# Patient Record
Sex: Female | Born: 1970
Health system: Southern US, Community
[De-identification: ages and names within clinical notes are randomized; demographics above are authoritative.]

## PROBLEM LIST (undated history)

## (undated) DIAGNOSIS — D649 Anemia, unspecified: Secondary | ICD-10-CM

## (undated) DIAGNOSIS — K219 Gastro-esophageal reflux disease without esophagitis: Secondary | ICD-10-CM

## (undated) DIAGNOSIS — M1990 Unspecified osteoarthritis, unspecified site: Secondary | ICD-10-CM

## (undated) DIAGNOSIS — L440 Pityriasis rubra pilaris: Secondary | ICD-10-CM

## (undated) DIAGNOSIS — I10 Essential (primary) hypertension: Secondary | ICD-10-CM

## (undated) DIAGNOSIS — R7303 Prediabetes: Secondary | ICD-10-CM

## (undated) HISTORY — DX: Pityriasis rubra pilaris: L44.0

## (undated) HISTORY — DX: Essential (primary) hypertension: I10

## (undated) HISTORY — DX: Gastro-esophageal reflux disease without esophagitis: K21.9

## (undated) HISTORY — DX: Prediabetes: R73.03

## (undated) HISTORY — DX: Anemia, unspecified: D64.9

## (undated) HISTORY — DX: Unspecified osteoarthritis, unspecified site: M19.90

---

## 2010-12-09 ENCOUNTER — Ambulatory Visit (HOSPITAL_COMMUNITY)
Admission: RE | Admit: 2010-12-09 | Discharge: 2010-12-09 | Payer: Self-pay | Source: Home / Self Care | Attending: Obstetrics and Gynecology | Admitting: Obstetrics and Gynecology

## 2010-12-09 IMAGING — US US OB NUCHAL TRANSLUCENCY 1ST GEST
1 series · 14 of 28 positions shown · non-contrast
Comparison: none

[Series 1: us ob nuchal translucency 1st gest · 14 of 35 slices shown]
[im 2/35]
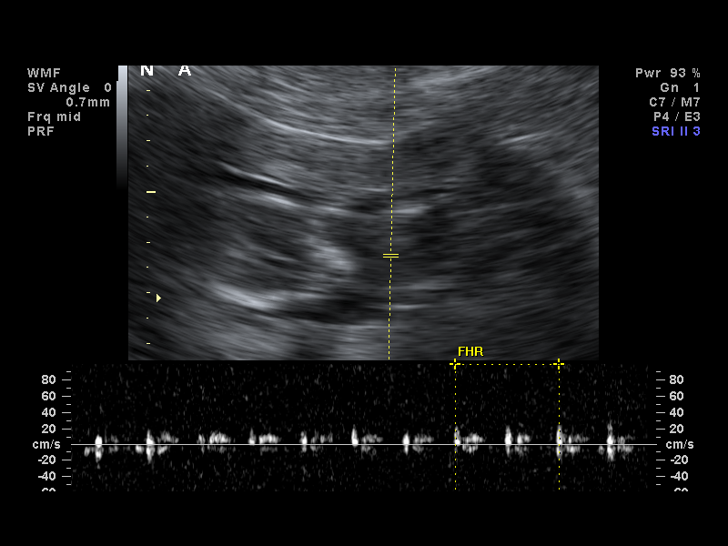
[im 4/35]
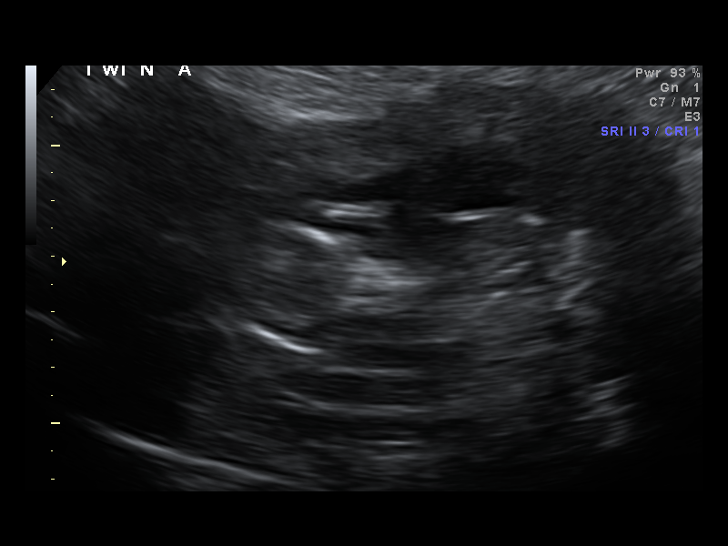
[im 7/35]
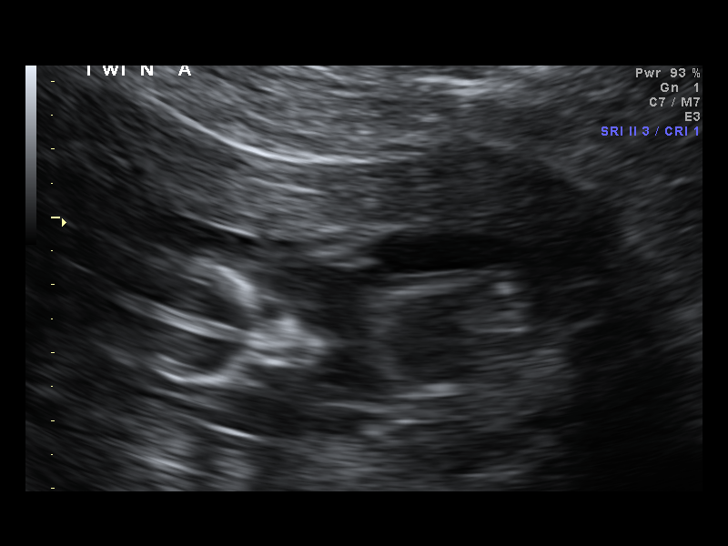
[im 9/35]
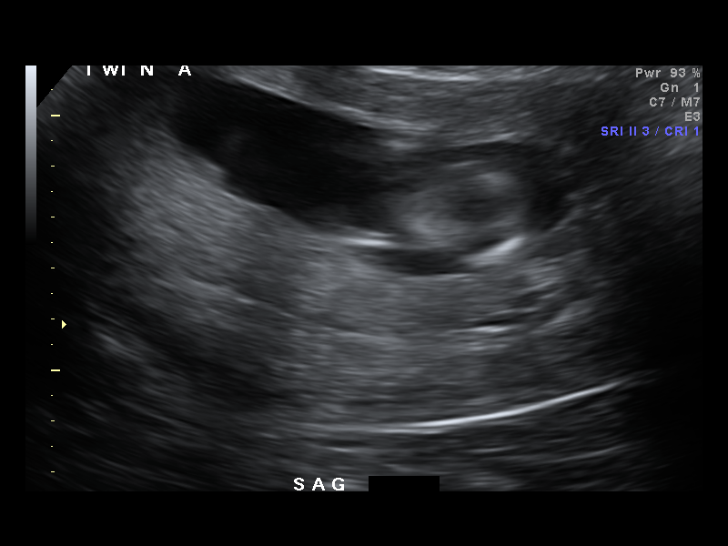
[im 12/35]
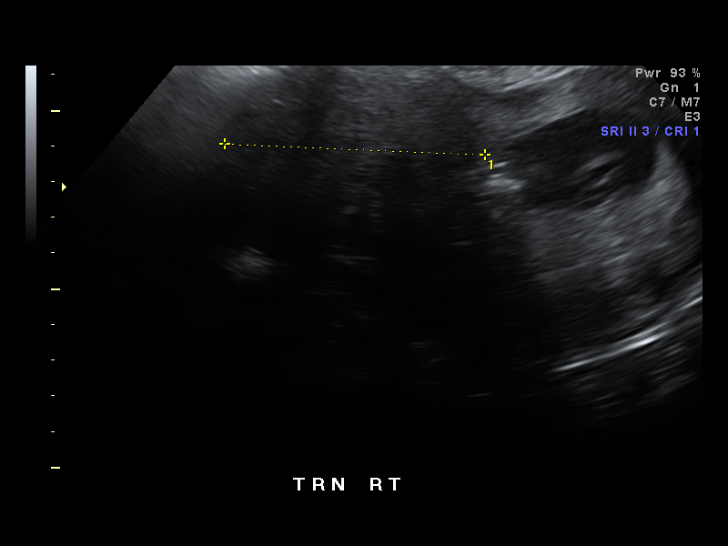
[im 14/35]
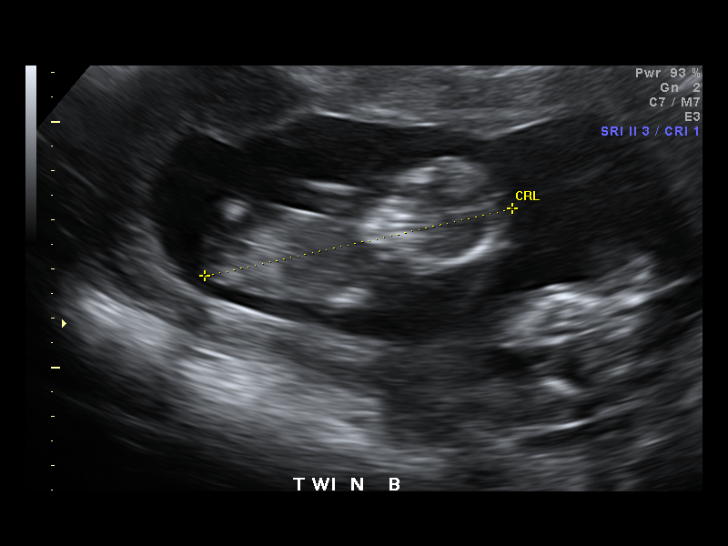
[im 17/35]
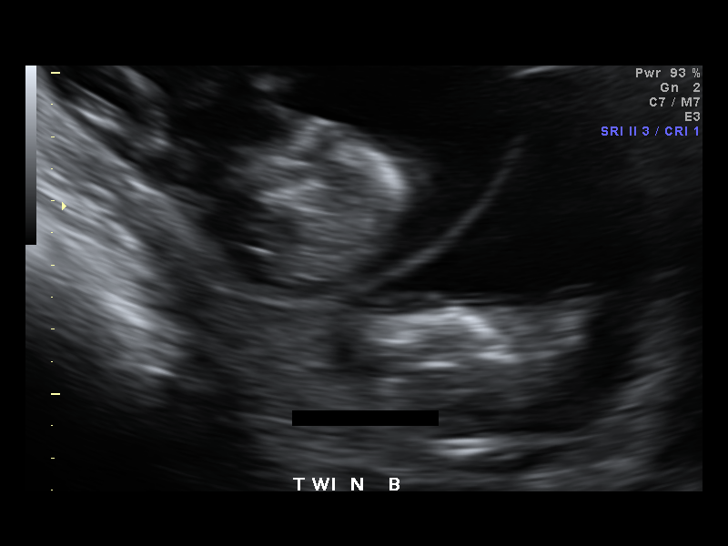
[im 19/35]
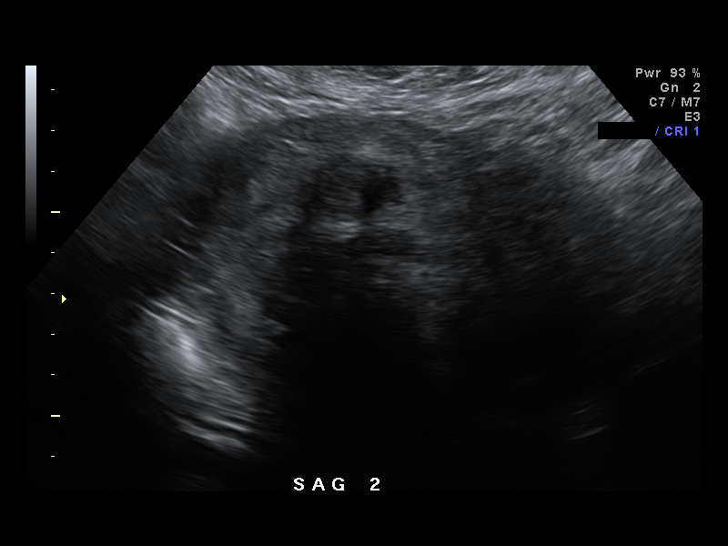
[im 22/35]
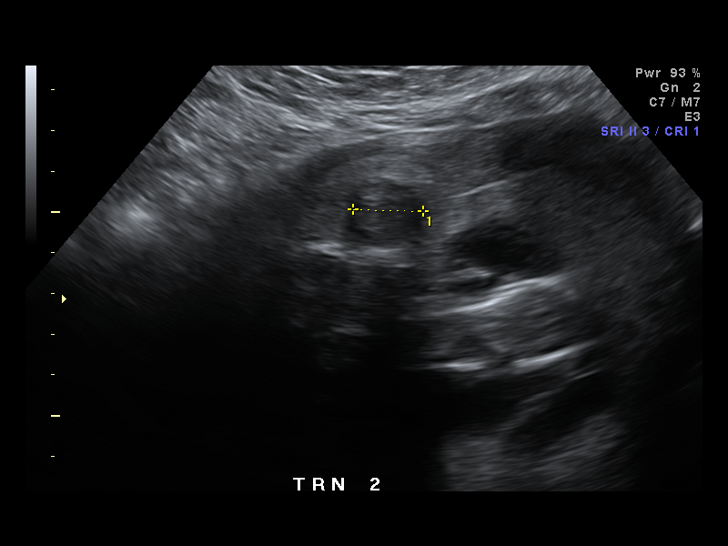
[im 24/35]
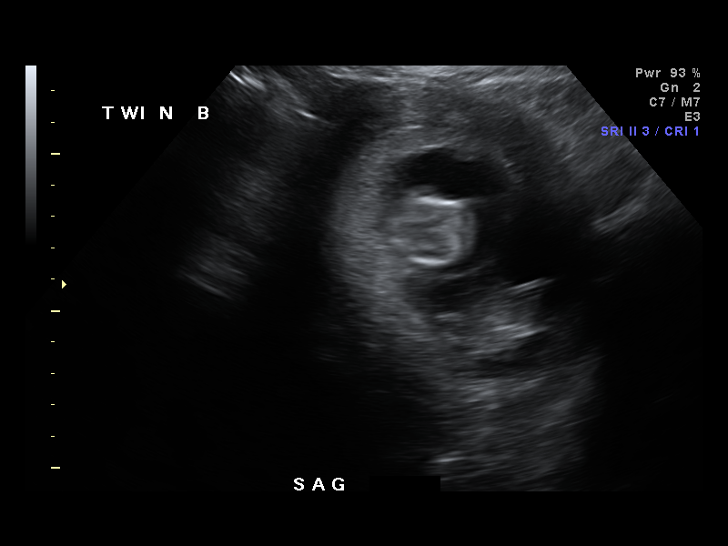
[im 27/35]
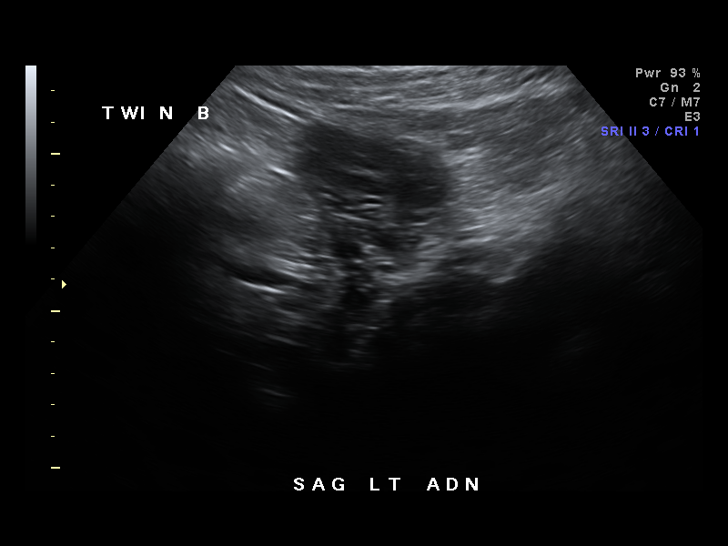
[im 29/35]
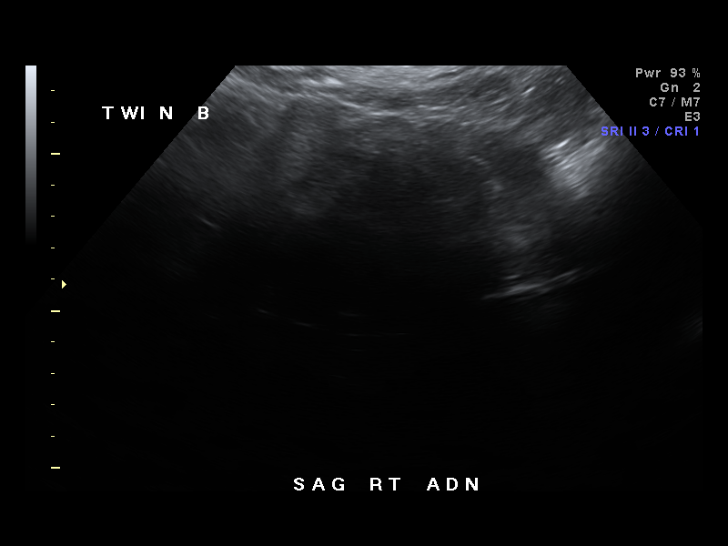
[im 32/35]
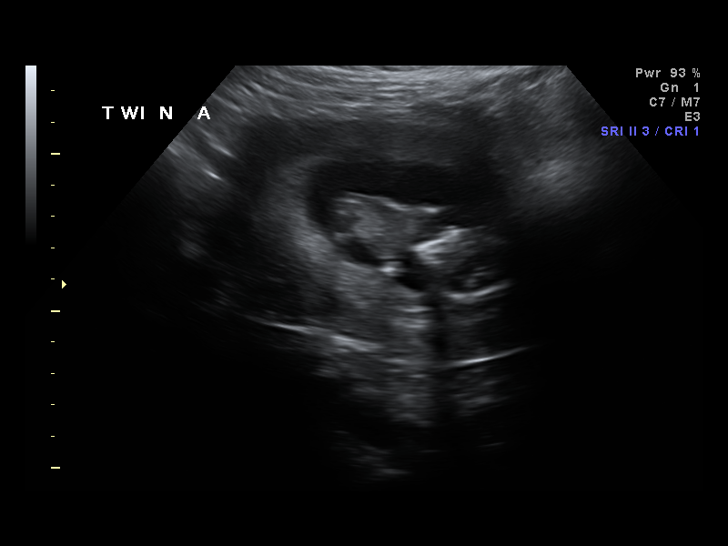
[im 35/35]
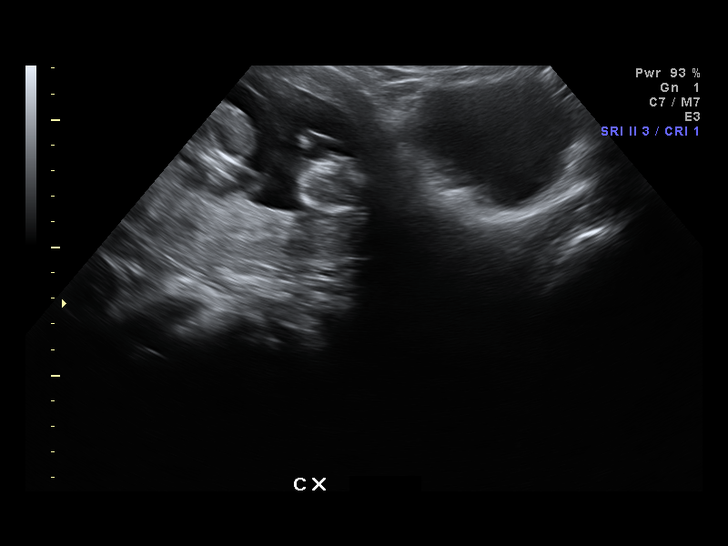

[14 of 28 positions shown; findings below may reference images not displayed]

Canned report from images found in remote index.

Refer to host system for actual result text.

## 2010-12-20 ENCOUNTER — Other Ambulatory Visit (HOSPITAL_COMMUNITY): Payer: Self-pay | Admitting: *Deleted

## 2010-12-20 DIAGNOSIS — O09529 Supervision of elderly multigravida, unspecified trimester: Secondary | ICD-10-CM

## 2011-01-20 ENCOUNTER — Other Ambulatory Visit (HOSPITAL_COMMUNITY): Payer: Self-pay | Admitting: *Deleted

## 2011-01-20 ENCOUNTER — Encounter (HOSPITAL_COMMUNITY): Payer: Self-pay

## 2011-01-20 ENCOUNTER — Ambulatory Visit (HOSPITAL_COMMUNITY)
Admission: RE | Admit: 2011-01-20 | Discharge: 2011-01-20 | Disposition: A | Discharge status: Home / Self Care | Payer: PRIVATE HEALTH INSURANCE | Source: Ambulatory Visit | Attending: Family Medicine | Admitting: Family Medicine

## 2011-01-20 ENCOUNTER — Ambulatory Visit (HOSPITAL_COMMUNITY)
Admission: RE | Admit: 2011-01-20 | Payer: PRIVATE HEALTH INSURANCE | Source: Ambulatory Visit | Attending: *Deleted | Admitting: *Deleted

## 2011-01-20 DIAGNOSIS — O34219 Maternal care for unspecified type scar from previous cesarean delivery: Secondary | ICD-10-CM | POA: Insufficient documentation

## 2011-01-20 DIAGNOSIS — O09529 Supervision of elderly multigravida, unspecified trimester: Secondary | ICD-10-CM | POA: Insufficient documentation

## 2011-01-20 DIAGNOSIS — O3410 Maternal care for benign tumor of corpus uteri, unspecified trimester: Secondary | ICD-10-CM | POA: Insufficient documentation

## 2011-01-20 DIAGNOSIS — IMO0001 Reserved for inherently not codable concepts without codable children: Secondary | ICD-10-CM

## 2011-01-20 DIAGNOSIS — O30009 Twin pregnancy, unspecified number of placenta and unspecified number of amniotic sacs, unspecified trimester: Secondary | ICD-10-CM | POA: Insufficient documentation

## 2011-01-20 DIAGNOSIS — O10019 Pre-existing essential hypertension complicating pregnancy, unspecified trimester: Secondary | ICD-10-CM | POA: Insufficient documentation

## 2011-01-20 IMAGING — US US PELV EXAM
1 series · 14 of 25 positions shown · non-contrast
Comparison: none

[Series 1: us pelv exam · 14 of 111 slices shown]
[im 1/111]
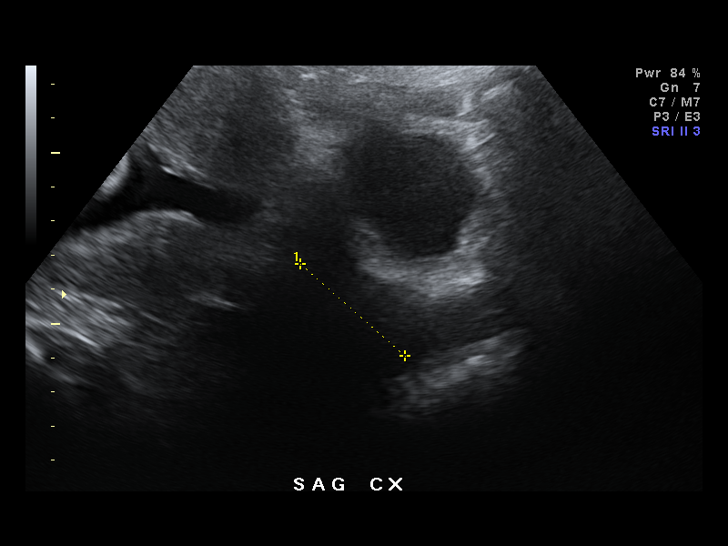
[im 10/111]
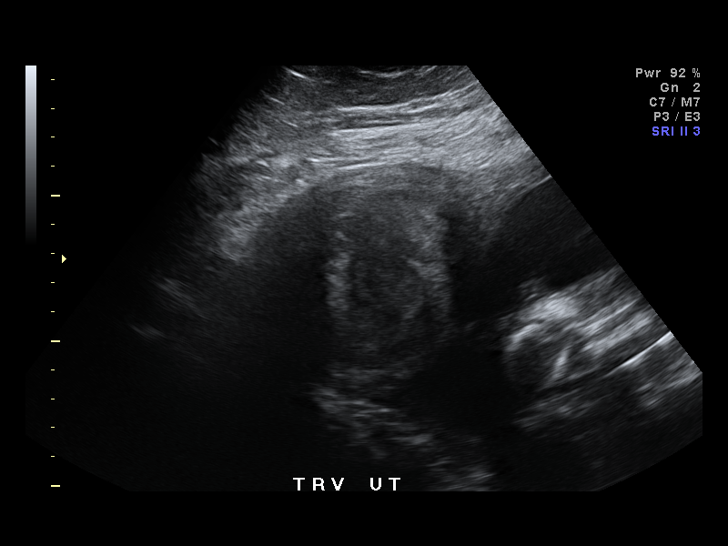
[im 19/111]
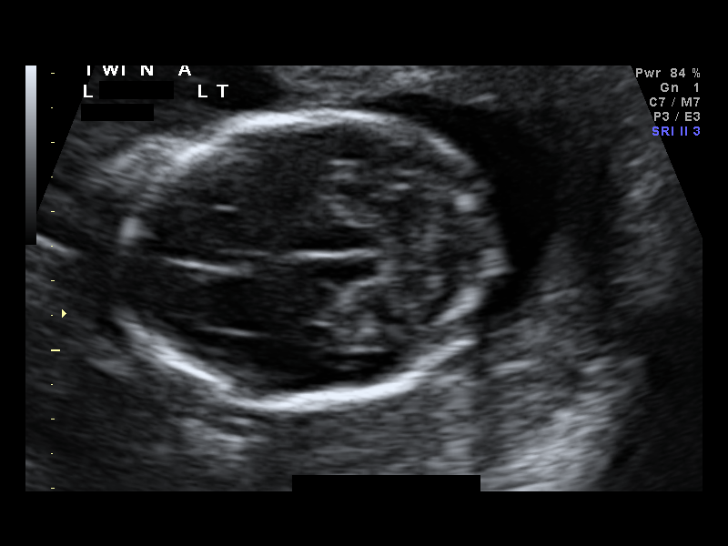
[im 28/111]
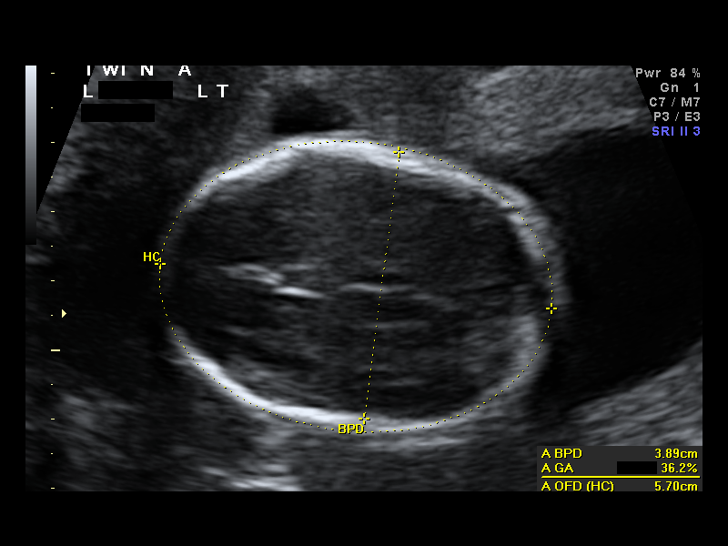
[im 37/111]
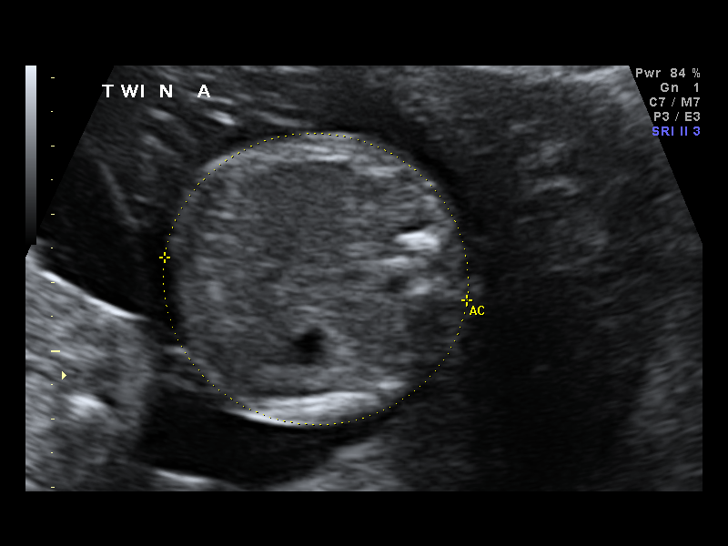
[im 42/111]
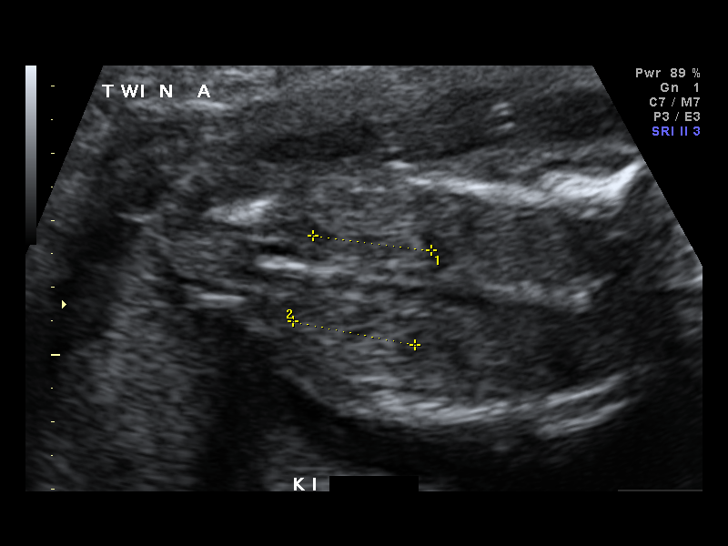
[im 51/111]
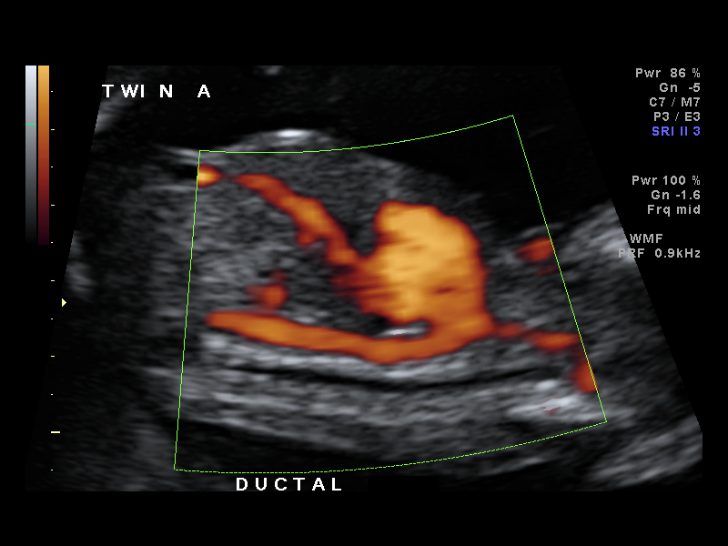
[im 60/111]
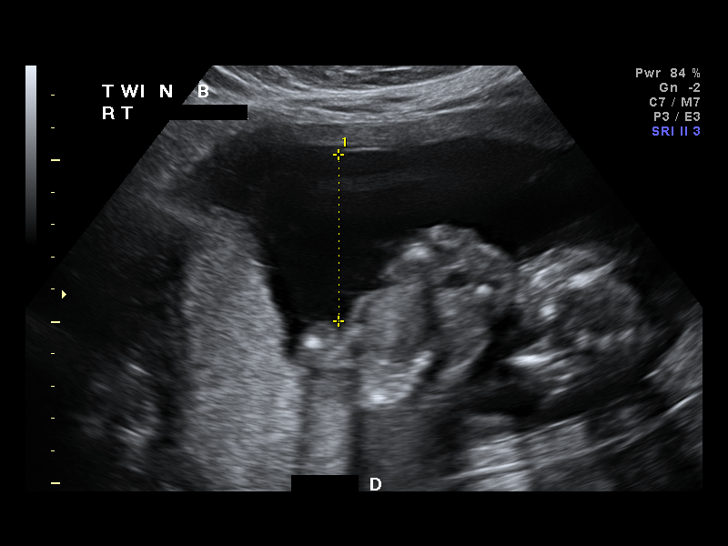
[im 69/111]
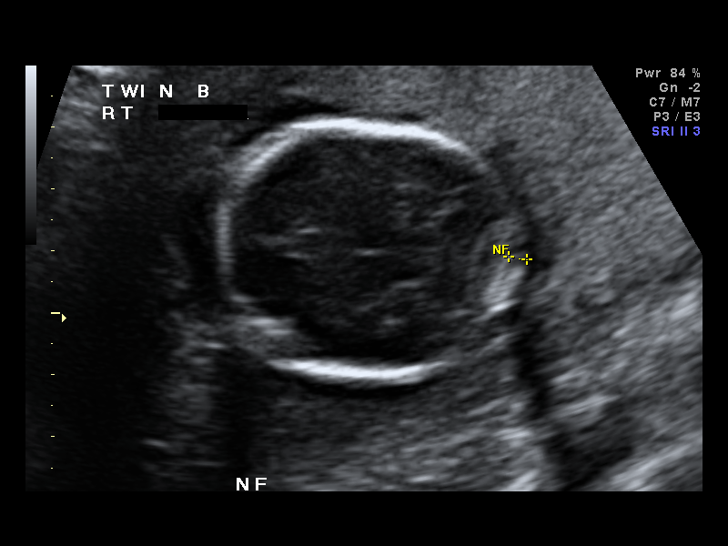
[im 74/111]
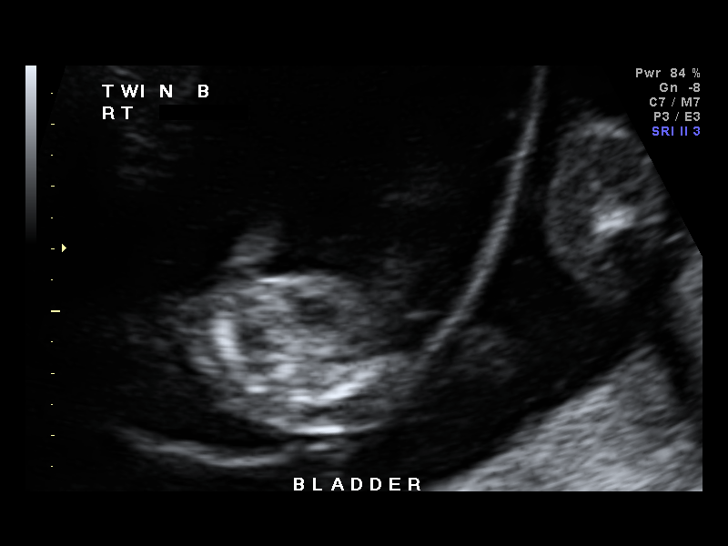
[im 83/111]
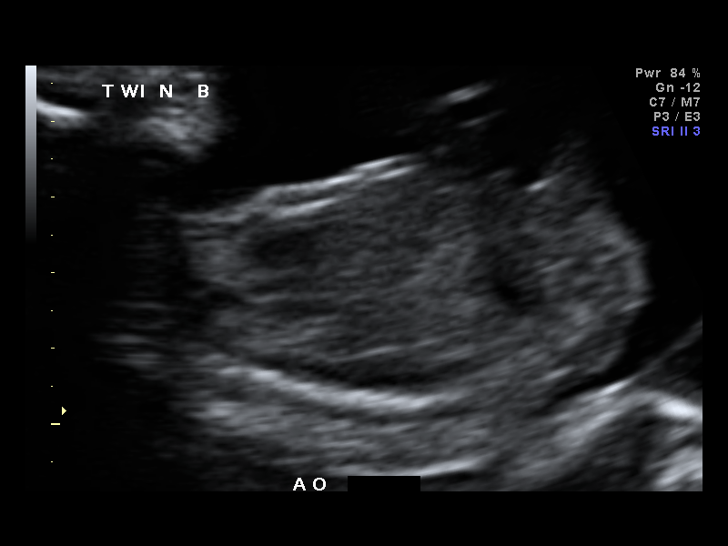
[im 92/111]
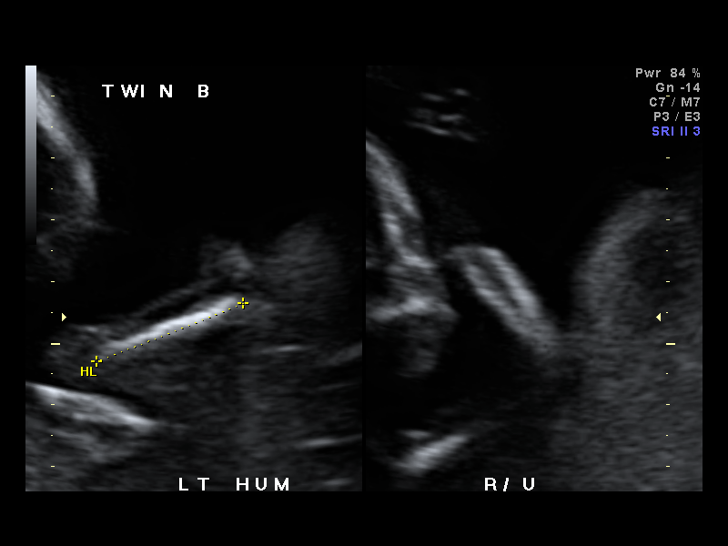
[im 101/111]
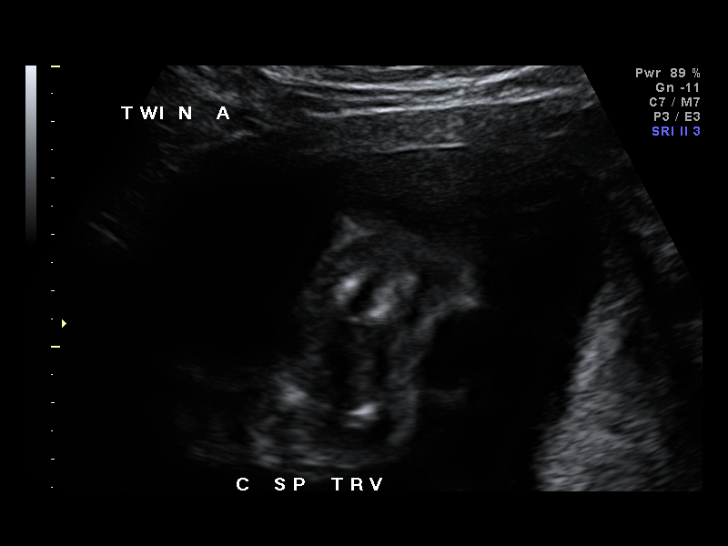
[im 111/111]
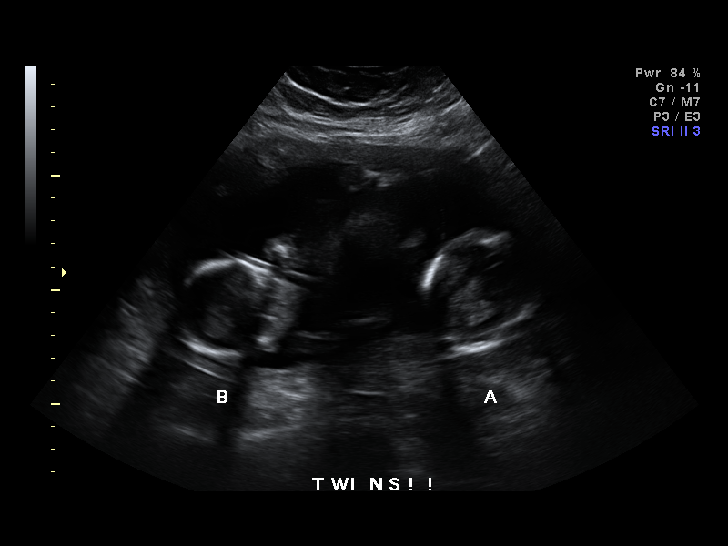

[14 of 25 positions shown; findings below may reference images not displayed]

Canned report from images found in remote index.

Refer to host system for actual result text.

## 2011-02-12 ENCOUNTER — Ambulatory Visit (HOSPITAL_COMMUNITY)
Admission: RE | Admit: 2011-02-12 | Discharge: 2011-02-12 | Disposition: A | Discharge status: Home / Self Care | Payer: PRIVATE HEALTH INSURANCE | Source: Ambulatory Visit | Attending: Obstetrics and Gynecology | Admitting: Obstetrics and Gynecology

## 2011-02-12 ENCOUNTER — Ambulatory Visit (HOSPITAL_COMMUNITY)
Admission: RE | Admit: 2011-02-12 | Discharge: 2011-02-12 | Disposition: A | Discharge status: Home / Self Care | Payer: PRIVATE HEALTH INSURANCE | Source: Ambulatory Visit | Attending: *Deleted | Admitting: *Deleted

## 2011-02-12 DIAGNOSIS — O10019 Pre-existing essential hypertension complicating pregnancy, unspecified trimester: Secondary | ICD-10-CM | POA: Insufficient documentation

## 2011-02-12 DIAGNOSIS — IMO0001 Reserved for inherently not codable concepts without codable children: Secondary | ICD-10-CM

## 2011-02-12 DIAGNOSIS — O34219 Maternal care for unspecified type scar from previous cesarean delivery: Secondary | ICD-10-CM | POA: Insufficient documentation

## 2011-02-12 DIAGNOSIS — O3410 Maternal care for benign tumor of corpus uteri, unspecified trimester: Secondary | ICD-10-CM | POA: Insufficient documentation

## 2011-02-12 DIAGNOSIS — O09529 Supervision of elderly multigravida, unspecified trimester: Secondary | ICD-10-CM | POA: Insufficient documentation

## 2012-11-30 HISTORY — PX: PARTIAL HYSTERECTOMY: SHX80

## 2014-10-01 ENCOUNTER — Encounter (HOSPITAL_COMMUNITY): Payer: Self-pay

## 2014-11-30 HISTORY — PX: ABDOMINAL HERNIA REPAIR: SHX539

## 2017-11-05 DIAGNOSIS — B9689 Other specified bacterial agents as the cause of diseases classified elsewhere: Secondary | ICD-10-CM | POA: Diagnosis not present

## 2017-11-05 DIAGNOSIS — Z6834 Body mass index (BMI) 34.0-34.9, adult: Secondary | ICD-10-CM | POA: Diagnosis not present

## 2017-11-05 DIAGNOSIS — J019 Acute sinusitis, unspecified: Secondary | ICD-10-CM | POA: Diagnosis not present

## 2018-02-07 DIAGNOSIS — Z8619 Personal history of other infectious and parasitic diseases: Secondary | ICD-10-CM | POA: Diagnosis not present

## 2018-02-07 DIAGNOSIS — J0120 Acute ethmoidal sinusitis, unspecified: Secondary | ICD-10-CM | POA: Diagnosis not present

## 2018-02-07 DIAGNOSIS — I10 Essential (primary) hypertension: Secondary | ICD-10-CM | POA: Diagnosis not present

## 2018-02-07 DIAGNOSIS — Z6833 Body mass index (BMI) 33.0-33.9, adult: Secondary | ICD-10-CM | POA: Diagnosis not present

## 2018-03-31 DIAGNOSIS — Z6833 Body mass index (BMI) 33.0-33.9, adult: Secondary | ICD-10-CM | POA: Diagnosis not present

## 2018-03-31 DIAGNOSIS — L723 Sebaceous cyst: Secondary | ICD-10-CM | POA: Diagnosis not present

## 2018-03-31 DIAGNOSIS — L089 Local infection of the skin and subcutaneous tissue, unspecified: Secondary | ICD-10-CM | POA: Diagnosis not present

## 2018-03-31 DIAGNOSIS — Z1231 Encounter for screening mammogram for malignant neoplasm of breast: Secondary | ICD-10-CM | POA: Diagnosis not present

## 2019-01-18 DIAGNOSIS — E782 Mixed hyperlipidemia: Secondary | ICD-10-CM | POA: Diagnosis not present

## 2019-01-18 DIAGNOSIS — I10 Essential (primary) hypertension: Secondary | ICD-10-CM | POA: Diagnosis not present

## 2019-01-18 DIAGNOSIS — J329 Chronic sinusitis, unspecified: Secondary | ICD-10-CM | POA: Diagnosis not present

## 2019-01-18 DIAGNOSIS — J40 Bronchitis, not specified as acute or chronic: Secondary | ICD-10-CM | POA: Diagnosis not present

## 2019-01-18 DIAGNOSIS — E1169 Type 2 diabetes mellitus with other specified complication: Secondary | ICD-10-CM | POA: Diagnosis not present

## 2019-01-18 DIAGNOSIS — E669 Obesity, unspecified: Secondary | ICD-10-CM | POA: Diagnosis not present

## 2019-04-06 DIAGNOSIS — Z03818 Encounter for observation for suspected exposure to other biological agents ruled out: Secondary | ICD-10-CM | POA: Diagnosis not present

## 2019-05-01 DIAGNOSIS — Z03818 Encounter for observation for suspected exposure to other biological agents ruled out: Secondary | ICD-10-CM | POA: Diagnosis not present

## 2019-05-30 DIAGNOSIS — Z03818 Encounter for observation for suspected exposure to other biological agents ruled out: Secondary | ICD-10-CM | POA: Diagnosis not present

## 2019-06-04 DIAGNOSIS — Z03818 Encounter for observation for suspected exposure to other biological agents ruled out: Secondary | ICD-10-CM | POA: Diagnosis not present

## 2019-06-15 DIAGNOSIS — Z03818 Encounter for observation for suspected exposure to other biological agents ruled out: Secondary | ICD-10-CM | POA: Diagnosis not present

## 2019-06-22 DIAGNOSIS — Z03818 Encounter for observation for suspected exposure to other biological agents ruled out: Secondary | ICD-10-CM | POA: Diagnosis not present

## 2019-06-28 DIAGNOSIS — Z1231 Encounter for screening mammogram for malignant neoplasm of breast: Secondary | ICD-10-CM | POA: Diagnosis not present

## 2019-06-28 DIAGNOSIS — N83201 Unspecified ovarian cyst, right side: Secondary | ICD-10-CM | POA: Insufficient documentation

## 2019-06-28 DIAGNOSIS — R102 Pelvic and perineal pain: Secondary | ICD-10-CM | POA: Diagnosis not present

## 2019-06-28 DIAGNOSIS — Z01419 Encounter for gynecological examination (general) (routine) without abnormal findings: Secondary | ICD-10-CM | POA: Diagnosis not present

## 2019-06-28 DIAGNOSIS — N951 Menopausal and female climacteric states: Secondary | ICD-10-CM | POA: Diagnosis not present

## 2019-07-10 DIAGNOSIS — R102 Pelvic and perineal pain: Secondary | ICD-10-CM | POA: Diagnosis not present

## 2019-07-12 DIAGNOSIS — Z1159 Encounter for screening for other viral diseases: Secondary | ICD-10-CM | POA: Diagnosis not present

## 2019-07-19 DIAGNOSIS — Z1159 Encounter for screening for other viral diseases: Secondary | ICD-10-CM | POA: Diagnosis not present

## 2019-07-25 DIAGNOSIS — Z1159 Encounter for screening for other viral diseases: Secondary | ICD-10-CM | POA: Diagnosis not present

## 2019-07-31 DIAGNOSIS — Z1159 Encounter for screening for other viral diseases: Secondary | ICD-10-CM | POA: Diagnosis not present

## 2019-08-09 DIAGNOSIS — Z1159 Encounter for screening for other viral diseases: Secondary | ICD-10-CM | POA: Diagnosis not present

## 2019-08-13 DIAGNOSIS — Z1159 Encounter for screening for other viral diseases: Secondary | ICD-10-CM | POA: Diagnosis not present

## 2019-08-24 ENCOUNTER — Encounter: Payer: Self-pay | Admitting: Sports Medicine

## 2019-08-24 ENCOUNTER — Ambulatory Visit (INDEPENDENT_AMBULATORY_CARE_PROVIDER_SITE_OTHER): Payer: BC Managed Care – PPO

## 2019-08-24 ENCOUNTER — Other Ambulatory Visit: Payer: Self-pay | Admitting: Sports Medicine

## 2019-08-24 ENCOUNTER — Other Ambulatory Visit: Payer: Self-pay

## 2019-08-24 ENCOUNTER — Ambulatory Visit: Payer: BC Managed Care – PPO | Admitting: Sports Medicine

## 2019-08-24 DIAGNOSIS — M79671 Pain in right foot: Secondary | ICD-10-CM

## 2019-08-24 DIAGNOSIS — S93601A Unspecified sprain of right foot, initial encounter: Secondary | ICD-10-CM

## 2019-08-24 DIAGNOSIS — Z1159 Encounter for screening for other viral diseases: Secondary | ICD-10-CM | POA: Diagnosis not present

## 2019-08-24 DIAGNOSIS — M84374A Stress fracture, right foot, initial encounter for fracture: Secondary | ICD-10-CM

## 2019-08-24 DIAGNOSIS — S99921A Unspecified injury of right foot, initial encounter: Secondary | ICD-10-CM | POA: Diagnosis not present

## 2019-08-24 MED ORDER — IBUPROFEN 800 MG PO TABS: 800.0000 mg | ORAL_TABLET | Freq: Three times a day (TID) | ORAL | 0 refills | Status: DC | PRN

## 2019-08-24 MED ORDER — HYDROCODONE-ACETAMINOPHEN 10-325 MG PO TABS: 1.0000 | ORAL_TABLET | Freq: Four times a day (QID) | ORAL | 0 refills | Status: DC | PRN

## 2019-08-24 NOTE — Progress Notes (Signed)
Subjective: Annette Beard is a 48 y.o. female patient who presents to office for evaluation of right foot pain reports that on yesterday she fell down the steps trying to avoid stepping on the cat reports that she has pain on the lateral aspect of the foot below the ankle reports that pain is 8-9 out of 10 throbbing constant in nature worse with walking or any type of movement reports that initially after her injury she iced and elevated for a few hours then proceeded to go to work she had to work 12 hours last night but could not had to go home early due to the intense pain on her right foot reports that there has been swelling has been icing elevating using a crutch and trying to stay off of the foot as much as possible.  Patient denies any redness warmth or any other constitutional symptoms like nausea vomiting fever chills.  Review of Systems  All other systems reviewed and are negative.    There are no active problems to display for this patient.   No current outpatient medications on file prior to visit.   No current facility-administered medications on file prior to visit.     Allergies  Allergen Reactions  . Levofloxacin Anaphylaxis  . Moxifloxacin Anaphylaxis  . Ciprofloxacin Swelling    Objective:  General: Alert and oriented x3 in no acute distress  Dermatology: No open lesions bilateral lower extremities, no webspace macerations, faint ecchymosis to the dorsal lateral right foot, all nails x 10 are well manicured.  Vascular: Dorsalis Pedis and Posterior Tibial pedal pulses palpable, Capillary Fill Time 3 seconds,(+) pedal hair growth bilateral, no edema bilateral lower extremities, Temperature gradient within normal limits.  Neurology: Gross sensation intact via light touch bilateral, however there is hypersensitivity on right due to pain.  Musculoskeletal: Mild to moderate tenderness palpation at the base of the fourth and fifth metatarsals as well as along the peroneal  tendon insertion on the right.  Range of motion appears to be within normal limit with no major limitations however there is mild guarding.  Gait: Antalgic gait crutch assisted  Xrays  Right foot   Impression: There is no obvious fracture but suspecting a possible stress fracture at the base of the fifth metatarsal versus soft tissue injury there is mild soft tissue swelling no other acute findings.  Assessment and Plan: Problem List Items Addressed This Visit    None    Visit Diagnoses    Stress fracture, right foot, initial encounter for fracture    -  Primary   Relevant Medications   HYDROcodone-acetaminophen (NORCO) 10-325 MG tablet   ibuprofen (ADVIL) 800 MG tablet   Sprain of right foot, initial encounter       Injury of right foot, initial encounter       Relevant Medications   HYDROcodone-acetaminophen (NORCO) 10-325 MG tablet   ibuprofen (ADVIL) 800 MG tablet   Right foot pain           -Complete examination performed -Xrays reviewed -Discussed treatement options for stress fracture versus sprain injury -Rx Norco and Motrin to take for severe pain -Teacher, English as a foreign language boot for patient to keep intact for the next 5 days after 5 days may remove and apply compression sleeve patient to continue with using crutch and cam boot as dispensed this visit on next week patient may consider putting pressure on the foot completely and weaning from the crutch as long as her pain appears controlled -Patient cannot work for  1 week due to extensive pain and swelling and need for narcotics in order to help with this injury -Work note given -Encouraged rest ice elevation until pain is symptoms improve -Patient to return to office 1 week or sooner if condition worsens.  Asencion Islam, DPM

## 2019-08-30 ENCOUNTER — Other Ambulatory Visit: Payer: Self-pay | Admitting: Sports Medicine

## 2019-08-30 DIAGNOSIS — M84374A Stress fracture, right foot, initial encounter for fracture: Secondary | ICD-10-CM

## 2019-08-30 DIAGNOSIS — M79671 Pain in right foot: Secondary | ICD-10-CM

## 2019-08-31 ENCOUNTER — Ambulatory Visit: Payer: BC Managed Care – PPO | Admitting: Sports Medicine

## 2019-08-31 ENCOUNTER — Other Ambulatory Visit: Payer: Self-pay

## 2019-08-31 ENCOUNTER — Encounter: Payer: Self-pay | Admitting: Sports Medicine

## 2019-08-31 DIAGNOSIS — S99921A Unspecified injury of right foot, initial encounter: Secondary | ICD-10-CM

## 2019-08-31 DIAGNOSIS — S93601D Unspecified sprain of right foot, subsequent encounter: Secondary | ICD-10-CM | POA: Diagnosis not present

## 2019-08-31 DIAGNOSIS — S99921D Unspecified injury of right foot, subsequent encounter: Secondary | ICD-10-CM

## 2019-08-31 DIAGNOSIS — M84374D Stress fracture, right foot, subsequent encounter for fracture with routine healing: Secondary | ICD-10-CM

## 2019-08-31 DIAGNOSIS — M84374A Stress fracture, right foot, initial encounter for fracture: Secondary | ICD-10-CM

## 2019-08-31 DIAGNOSIS — M79671 Pain in right foot: Secondary | ICD-10-CM

## 2019-08-31 MED ORDER — IBUPROFEN 800 MG PO TABS: 800.0000 mg | ORAL_TABLET | Freq: Three times a day (TID) | ORAL | 0 refills | Status: DC | PRN

## 2019-08-31 MED ORDER — HYDROCODONE-ACETAMINOPHEN 10-325 MG PO TABS: 1.0000 | ORAL_TABLET | Freq: Four times a day (QID) | ORAL | 0 refills | Status: DC | PRN

## 2019-08-31 MED ORDER — DICLOFENAC SODIUM 1 % TD GEL: 4.0000 g | Freq: Four times a day (QID) | TRANSDERMAL | 1 refills | Status: DC

## 2019-08-31 NOTE — Progress Notes (Signed)
Subjective: Annette Beard is a 48 y.o. female patient who returns office for follow-up evaluation of right foot pain.  Patient reports that pain is doing much better but is still not gone reports that even with the boot she feels a little bit of pain over the dorsal lateral aspect of her right foot reports that the pain is 3-4 out of 10 aching and stabbing off and on.  Patient reports that the pain depends on her activity but she has noticed a significant improvement with the swelling.  Patient is consistent with using cam boot walking pain taking Motrin and resting and using compression sleeve.  Patient denies nausea vomiting fever chills or any other constitutional symptoms at this time.   There are no active problems to display for this patient.   Current Outpatient Medications on File Prior to Visit  Medication Sig Dispense Refill  . bisoprolol-hydrochlorothiazide (ZIAC) 10-6.25 MG tablet Take 1 tablet by mouth 2 (two) times daily.    Marland Kitchen esomeprazole (NEXIUM) 40 MG capsule Take by mouth daily.    . meloxicam (MOBIC) 15 MG tablet TAKE 1 TABLET BY MOUTH EVERY DAY FOR PAIN    . telmisartan (MICARDIS) 40 MG tablet TAKE 1 (ONE) TABLET DAILY    . TRULICITY 1.5 MG/0.5ML SOPN INJECT 1.5MG  ONCE A WEEK     No current facility-administered medications on file prior to visit.     Allergies  Allergen Reactions  . Levofloxacin Anaphylaxis  . Moxifloxacin Anaphylaxis  . Ciprofloxacin Swelling    Objective:  General: Alert and oriented x3 in no acute distress  Dermatology: No open lesions bilateral lower extremities, no webspace macerations, faint ecchymosis to the dorsal lateral right foot, all nails x 10 are well manicured.  Vascular: Dorsalis Pedis and Posterior Tibial pedal pulses palpable, Capillary Fill Time 3 seconds,(+) pedal hair growth bilateral, no edema bilateral lower extremities, Temperature gradient within normal limits.  Neurology: Michaell Cowing sensation intact via light touch  bilateral.  Musculoskeletal: Mild tenderness palpation at the base of the fourth and fifth metatarsals at the base as well as along the peroneal tendon insertion on the right.  Range of motion appears to be within normal limit with no major limitations however there is mild guarding.  Assessment and Plan: Problem List Items Addressed This Visit    None    Visit Diagnoses    Stress fracture of right foot with routine healing    -  Primary   Sprain of right foot, subsequent encounter       Relevant Medications   HYDROcodone-acetaminophen (NORCO) 10-325 MG tablet   ibuprofen (ADVIL) 800 MG tablet   diclofenac sodium (VOLTAREN) 1 % GEL   Injury of right foot, subsequent encounter       Right foot pain       Stress fracture, right foot, initial encounter for fracture       Injury of right foot, initial encounter           -Complete examination performed -Re-discussed treatment options for stress fracture versus sprain injury that is slowly healing -Refilled Norco and Motrin to take for severe pain and prescribed topical Voltaren to use at minimum twice daily -Advised patient to continue with cam boot for at least 1 week may slowly wean after a week to a tennis shoe -Encouraged gentle range of motion in the evening and to continue with elevation and icing -Work note given to remain out of work for the next 2 weeks -Patient to return to office  2 weeks or sooner if condition worsens.  Landis Martins, DPM

## 2019-09-14 ENCOUNTER — Encounter: Payer: Self-pay | Admitting: Sports Medicine

## 2019-09-14 ENCOUNTER — Other Ambulatory Visit: Payer: Self-pay

## 2019-09-14 ENCOUNTER — Ambulatory Visit (INDEPENDENT_AMBULATORY_CARE_PROVIDER_SITE_OTHER): Payer: BC Managed Care – PPO

## 2019-09-14 ENCOUNTER — Other Ambulatory Visit: Payer: Self-pay | Admitting: Sports Medicine

## 2019-09-14 ENCOUNTER — Ambulatory Visit: Payer: BC Managed Care – PPO | Admitting: Sports Medicine

## 2019-09-14 DIAGNOSIS — S93601D Unspecified sprain of right foot, subsequent encounter: Secondary | ICD-10-CM | POA: Diagnosis not present

## 2019-09-14 DIAGNOSIS — M79671 Pain in right foot: Secondary | ICD-10-CM

## 2019-09-14 DIAGNOSIS — M84374D Stress fracture, right foot, subsequent encounter for fracture with routine healing: Secondary | ICD-10-CM

## 2019-09-14 DIAGNOSIS — S99921D Unspecified injury of right foot, subsequent encounter: Secondary | ICD-10-CM | POA: Diagnosis not present

## 2019-09-14 NOTE — Progress Notes (Signed)
Subjective: Annette Beard is a 48 y.o. female patient who returns office for follow-up evaluation of right foot pain.  Patient reports that pain is doing better. Pain is 3-4/10 when she is up on it too much. Has been using brace, mobic, and resting with some range of motion. Patient denies any re-injury. Patient denies nausea vomiting fever chills or any other constitutional symptoms at this time.   There are no active problems to display for this patient.   Current Outpatient Medications on File Prior to Visit  Medication Sig Dispense Refill  . bisoprolol-hydrochlorothiazide (ZIAC) 10-6.25 MG tablet Take 1 tablet by mouth 2 (two) times daily.    . diclofenac sodium (VOLTAREN) 1 % GEL Apply 4 g topically 4 (four) times daily. 150 g 1  . esomeprazole (NEXIUM) 40 MG capsule Take by mouth daily.    Marland Kitchen HYDROcodone-acetaminophen (NORCO) 10-325 MG tablet Take 1 tablet by mouth every 6 (six) hours as needed. 20 tablet 0  . ibuprofen (ADVIL) 800 MG tablet Take 1 tablet (800 mg total) by mouth every 8 (eight) hours as needed. 30 tablet 0  . meloxicam (MOBIC) 15 MG tablet TAKE 1 TABLET BY MOUTH EVERY DAY FOR PAIN    . telmisartan (MICARDIS) 40 MG tablet TAKE 1 (ONE) TABLET DAILY    . TRULICITY 1.5 NW/2.9FA SOPN INJECT 1.5MG  ONCE A WEEK     No current facility-administered medications on file prior to visit.     Allergies  Allergen Reactions  . Levofloxacin Anaphylaxis  . Moxifloxacin Anaphylaxis  . Ciprofloxacin Swelling    Objective:  General: Alert and oriented x3 in no acute distress  Dermatology: No open lesions bilateral lower extremities, no webspace macerations, faint ecchymosis to the dorsal lateral right foot, all nails x 10 are well manicured.  Vascular: Dorsalis Pedis and Posterior Tibial pedal pulses palpable, Capillary Fill Time 3 seconds,(+) pedal hair growth bilateral, no edema bilateral lower extremities, Temperature gradient within normal limits.  Neurology: Johney Maine sensation  intact via light touch bilateral.  Musculoskeletal: Decreased tenderness palpation at the base of the fourth and fifth metatarsals at the base as well as along the peroneal tendon insertion on the right.  Range of motion appears to be within normal limit with no major limitations however there is mild guarding.  Assessment and Plan: Problem List Items Addressed This Visit    None    Visit Diagnoses    Stress fracture of right foot with routine healing    -  Primary   Sprain of right foot, subsequent encounter       Injury of right foot, subsequent encounter       Right foot pain          -Complete examination performed -Re-discussed treatment options for stress fracture versus sprain injury that is slowly healing and improving -Repeat xray ordered and reviewed reveal no acute osseous findings -Rx PT for at minimum 2 weeks -Continue with Mobic and topical Voltaren to use at minimum twice daily like before -Continue with elevation and icing and bracing -Patient to remain out of work for the next 2 weeks and if doing better may return to work week 3. Patient was also given a Rx for aflac -Patient to return to office 2 weeks or sooner if condition worsens.  Landis Martins, DPM

## 2019-09-18 DIAGNOSIS — R2689 Other abnormalities of gait and mobility: Secondary | ICD-10-CM | POA: Diagnosis not present

## 2019-09-18 DIAGNOSIS — M25571 Pain in right ankle and joints of right foot: Secondary | ICD-10-CM | POA: Diagnosis not present

## 2019-09-18 DIAGNOSIS — M25671 Stiffness of right ankle, not elsewhere classified: Secondary | ICD-10-CM | POA: Diagnosis not present

## 2019-09-20 ENCOUNTER — Other Ambulatory Visit: Payer: Self-pay | Admitting: Sports Medicine

## 2019-09-20 DIAGNOSIS — M84374D Stress fracture, right foot, subsequent encounter for fracture with routine healing: Secondary | ICD-10-CM

## 2019-09-26 DIAGNOSIS — M25571 Pain in right ankle and joints of right foot: Secondary | ICD-10-CM | POA: Diagnosis not present

## 2019-09-26 DIAGNOSIS — M25671 Stiffness of right ankle, not elsewhere classified: Secondary | ICD-10-CM | POA: Diagnosis not present

## 2019-09-26 DIAGNOSIS — R2689 Other abnormalities of gait and mobility: Secondary | ICD-10-CM | POA: Diagnosis not present

## 2019-10-04 ENCOUNTER — Encounter: Payer: Self-pay | Admitting: Sports Medicine

## 2019-10-04 ENCOUNTER — Ambulatory Visit: Payer: BC Managed Care – PPO | Admitting: Sports Medicine

## 2019-10-04 ENCOUNTER — Other Ambulatory Visit: Payer: Self-pay

## 2019-10-04 DIAGNOSIS — S93601D Unspecified sprain of right foot, subsequent encounter: Secondary | ICD-10-CM | POA: Diagnosis not present

## 2019-10-04 DIAGNOSIS — M79671 Pain in right foot: Secondary | ICD-10-CM

## 2019-10-04 DIAGNOSIS — M84374D Stress fracture, right foot, subsequent encounter for fracture with routine healing: Secondary | ICD-10-CM

## 2019-10-04 DIAGNOSIS — S99921D Unspecified injury of right foot, subsequent encounter: Secondary | ICD-10-CM | POA: Diagnosis not present

## 2019-10-04 NOTE — Progress Notes (Signed)
Subjective: Annette Beard is a 48 y.o. female patient who returns office for follow-up evaluation of right foot pain.  Patient reports that pain is doing better. Pain is 1-2/10. Finished therapy, Has been using brace, mobic, and resting with some range of motion. Reports that today is the first session she went without her brace and it feels good. Patient denies any re-injury. Patient denies nausea vomiting fever chills or any other constitutional symptoms at this time.   There are no active problems to display for this patient.   Current Outpatient Medications on File Prior to Visit  Medication Sig Dispense Refill  . bisoprolol-hydrochlorothiazide (ZIAC) 10-6.25 MG tablet Take 1 tablet by mouth 2 (two) times daily.    . diclofenac sodium (VOLTAREN) 1 % GEL Apply 4 g topically 4 (four) times daily. 150 g 1  . esomeprazole (NEXIUM) 40 MG capsule Take by mouth daily.    Marland Kitchen HYDROcodone-acetaminophen (NORCO) 10-325 MG tablet Take 1 tablet by mouth every 6 (six) hours as needed. 20 tablet 0  . ibuprofen (ADVIL) 800 MG tablet Take 1 tablet (800 mg total) by mouth every 8 (eight) hours as needed. 30 tablet 0  . meloxicam (MOBIC) 15 MG tablet TAKE 1 TABLET BY MOUTH EVERY DAY FOR PAIN    . telmisartan (MICARDIS) 40 MG tablet TAKE 1 (ONE) TABLET DAILY    . TRULICITY 1.5 UX/3.2TF SOPN INJECT 1.5MG  ONCE A WEEK     No current facility-administered medications on file prior to visit.     Allergies  Allergen Reactions  . Levofloxacin Anaphylaxis  . Moxifloxacin Anaphylaxis  . Ciprofloxacin Swelling    Objective:  General: Alert and oriented x3 in no acute distress  Dermatology: No open lesions bilateral lower extremities, no webspace macerations, faint ecchymosis to the dorsal lateral right foot, all nails x 10 are well manicured.  Vascular: Dorsalis Pedis and Posterior Tibial pedal pulses palpable, Capillary Fill Time 3 seconds,(+) pedal hair growth bilateral, no edema bilateral lower extremities,  Temperature gradient within normal limits.  Neurology: Johney Maine sensation intact via light touch bilateral.  Musculoskeletal: No tenderness palpation at the base of the fourth and fifth metatarsals at the base and decreased pain along the peroneal tendon insertion on the right.  Range of motion appears to be within normal limit with no major limitations however there is mild guarding.  Assessment and Plan: Problem List Items Addressed This Visit    None    Visit Diagnoses    Stress fracture of right foot with routine healing    -  Primary   Sprain of right foot, subsequent encounter       Injury of right foot, subsequent encounter       Right foot pain          -Complete examination performed -Re-discussed treatment options for stress fracture versus sprain injury that is now resolved -Physical therapy completed -Continue with Mobic and topical Voltaren to use at minimum twice daily like before needed for any small amounts of pain -Continue with elevation and icing and return to work with use of brace the first week of work; return 11/9 with no restrictions  -Patient to return to office PRN or sooner if condition worsens.  Landis Martins, DPM

## 2019-10-13 DIAGNOSIS — Z1159 Encounter for screening for other viral diseases: Secondary | ICD-10-CM | POA: Diagnosis not present

## 2019-10-16 ENCOUNTER — Other Ambulatory Visit: Payer: Self-pay | Admitting: Sports Medicine

## 2019-10-16 DIAGNOSIS — S93601D Unspecified sprain of right foot, subsequent encounter: Secondary | ICD-10-CM

## 2019-10-16 NOTE — Telephone Encounter (Signed)
Dr. Stover  Please advice 

## 2019-10-17 DIAGNOSIS — Z1159 Encounter for screening for other viral diseases: Secondary | ICD-10-CM | POA: Diagnosis not present

## 2019-10-19 DIAGNOSIS — Z1159 Encounter for screening for other viral diseases: Secondary | ICD-10-CM | POA: Diagnosis not present

## 2019-10-22 DIAGNOSIS — Z1159 Encounter for screening for other viral diseases: Secondary | ICD-10-CM | POA: Diagnosis not present

## 2019-10-24 DIAGNOSIS — Z1159 Encounter for screening for other viral diseases: Secondary | ICD-10-CM | POA: Diagnosis not present

## 2019-11-20 DIAGNOSIS — Z20828 Contact with and (suspected) exposure to other viral communicable diseases: Secondary | ICD-10-CM | POA: Diagnosis not present

## 2020-01-12 DIAGNOSIS — Z1231 Encounter for screening mammogram for malignant neoplasm of breast: Secondary | ICD-10-CM | POA: Diagnosis not present

## 2020-05-09 DIAGNOSIS — Z13220 Encounter for screening for lipoid disorders: Secondary | ICD-10-CM | POA: Diagnosis not present

## 2020-05-09 DIAGNOSIS — E669 Obesity, unspecified: Secondary | ICD-10-CM | POA: Diagnosis not present

## 2020-05-09 DIAGNOSIS — Z0001 Encounter for general adult medical examination with abnormal findings: Secondary | ICD-10-CM | POA: Diagnosis not present

## 2020-05-09 DIAGNOSIS — I10 Essential (primary) hypertension: Secondary | ICD-10-CM | POA: Diagnosis not present

## 2020-05-09 DIAGNOSIS — E1169 Type 2 diabetes mellitus with other specified complication: Secondary | ICD-10-CM | POA: Diagnosis not present

## 2020-05-10 DIAGNOSIS — E669 Obesity, unspecified: Secondary | ICD-10-CM | POA: Diagnosis not present

## 2020-05-10 DIAGNOSIS — E1169 Type 2 diabetes mellitus with other specified complication: Secondary | ICD-10-CM | POA: Diagnosis not present

## 2020-06-01 DIAGNOSIS — H6011 Cellulitis of right external ear: Secondary | ICD-10-CM | POA: Diagnosis not present

## 2020-06-01 DIAGNOSIS — I10 Essential (primary) hypertension: Secondary | ICD-10-CM | POA: Diagnosis not present

## 2020-07-04 DIAGNOSIS — N3001 Acute cystitis with hematuria: Secondary | ICD-10-CM | POA: Diagnosis not present

## 2020-07-04 DIAGNOSIS — Z6841 Body Mass Index (BMI) 40.0 and over, adult: Secondary | ICD-10-CM | POA: Diagnosis not present

## 2020-07-30 DIAGNOSIS — M25561 Pain in right knee: Secondary | ICD-10-CM | POA: Diagnosis not present

## 2020-07-30 DIAGNOSIS — E669 Obesity, unspecified: Secondary | ICD-10-CM | POA: Diagnosis not present

## 2020-07-30 DIAGNOSIS — E1169 Type 2 diabetes mellitus with other specified complication: Secondary | ICD-10-CM | POA: Diagnosis not present

## 2020-07-30 DIAGNOSIS — I10 Essential (primary) hypertension: Secondary | ICD-10-CM | POA: Diagnosis not present

## 2020-09-09 DIAGNOSIS — R252 Cramp and spasm: Secondary | ICD-10-CM | POA: Diagnosis not present

## 2020-10-01 DIAGNOSIS — N951 Menopausal and female climacteric states: Secondary | ICD-10-CM | POA: Diagnosis not present

## 2020-11-11 DIAGNOSIS — Z01419 Encounter for gynecological examination (general) (routine) without abnormal findings: Secondary | ICD-10-CM | POA: Diagnosis not present

## 2020-11-11 DIAGNOSIS — Z1231 Encounter for screening mammogram for malignant neoplasm of breast: Secondary | ICD-10-CM | POA: Diagnosis not present

## 2020-11-12 DIAGNOSIS — I10 Essential (primary) hypertension: Secondary | ICD-10-CM | POA: Insufficient documentation

## 2020-11-12 DIAGNOSIS — Z6841 Body Mass Index (BMI) 40.0 and over, adult: Secondary | ICD-10-CM | POA: Insufficient documentation

## 2021-01-06 DIAGNOSIS — J069 Acute upper respiratory infection, unspecified: Secondary | ICD-10-CM | POA: Diagnosis not present

## 2021-01-06 DIAGNOSIS — B354 Tinea corporis: Secondary | ICD-10-CM | POA: Diagnosis not present

## 2021-01-06 DIAGNOSIS — Z6841 Body Mass Index (BMI) 40.0 and over, adult: Secondary | ICD-10-CM | POA: Diagnosis not present

## 2021-02-21 DIAGNOSIS — M7051 Other bursitis of knee, right knee: Secondary | ICD-10-CM | POA: Diagnosis not present

## 2021-02-21 DIAGNOSIS — S8391XA Sprain of unspecified site of right knee, initial encounter: Secondary | ICD-10-CM | POA: Diagnosis not present

## 2021-02-21 DIAGNOSIS — Z8742 Personal history of other diseases of the female genital tract: Secondary | ICD-10-CM | POA: Diagnosis not present

## 2021-02-21 DIAGNOSIS — B354 Tinea corporis: Secondary | ICD-10-CM | POA: Diagnosis not present

## 2021-02-21 DIAGNOSIS — L02219 Cutaneous abscess of trunk, unspecified: Secondary | ICD-10-CM | POA: Diagnosis not present

## 2021-03-06 DIAGNOSIS — L578 Other skin changes due to chronic exposure to nonionizing radiation: Secondary | ICD-10-CM | POA: Diagnosis not present

## 2021-03-06 DIAGNOSIS — D1801 Hemangioma of skin and subcutaneous tissue: Secondary | ICD-10-CM | POA: Diagnosis not present

## 2021-03-06 DIAGNOSIS — D2239 Melanocytic nevi of other parts of face: Secondary | ICD-10-CM | POA: Diagnosis not present

## 2021-03-06 DIAGNOSIS — L209 Atopic dermatitis, unspecified: Secondary | ICD-10-CM | POA: Diagnosis not present

## 2021-03-27 DIAGNOSIS — M549 Dorsalgia, unspecified: Secondary | ICD-10-CM | POA: Diagnosis not present

## 2021-03-27 DIAGNOSIS — Z6839 Body mass index (BMI) 39.0-39.9, adult: Secondary | ICD-10-CM | POA: Diagnosis not present

## 2021-03-27 DIAGNOSIS — Z1331 Encounter for screening for depression: Secondary | ICD-10-CM | POA: Diagnosis not present

## 2021-04-03 DIAGNOSIS — S83011A Lateral subluxation of right patella, initial encounter: Secondary | ICD-10-CM | POA: Diagnosis not present

## 2021-04-03 DIAGNOSIS — S8391XA Sprain of unspecified site of right knee, initial encounter: Secondary | ICD-10-CM | POA: Diagnosis not present

## 2021-04-15 ENCOUNTER — Other Ambulatory Visit: Payer: Self-pay

## 2021-04-15 ENCOUNTER — Ambulatory Visit: Payer: BC Managed Care – PPO | Admitting: Sports Medicine

## 2021-04-15 ENCOUNTER — Ambulatory Visit (INDEPENDENT_AMBULATORY_CARE_PROVIDER_SITE_OTHER): Payer: BC Managed Care – PPO

## 2021-04-15 ENCOUNTER — Other Ambulatory Visit: Payer: Self-pay | Admitting: Sports Medicine

## 2021-04-15 ENCOUNTER — Encounter: Payer: Self-pay | Admitting: Sports Medicine

## 2021-04-15 DIAGNOSIS — M722 Plantar fascial fibromatosis: Secondary | ICD-10-CM

## 2021-04-15 DIAGNOSIS — M79673 Pain in unspecified foot: Secondary | ICD-10-CM

## 2021-04-15 DIAGNOSIS — M7742 Metatarsalgia, left foot: Secondary | ICD-10-CM

## 2021-04-15 DIAGNOSIS — M2142 Flat foot [pes planus] (acquired), left foot: Secondary | ICD-10-CM

## 2021-04-15 DIAGNOSIS — M79671 Pain in right foot: Secondary | ICD-10-CM

## 2021-04-15 DIAGNOSIS — M2141 Flat foot [pes planus] (acquired), right foot: Secondary | ICD-10-CM | POA: Diagnosis not present

## 2021-04-15 DIAGNOSIS — M7741 Metatarsalgia, right foot: Secondary | ICD-10-CM

## 2021-04-15 DIAGNOSIS — M79672 Pain in left foot: Secondary | ICD-10-CM

## 2021-04-15 NOTE — Progress Notes (Signed)
Subjective: Annette Beard is a 50 y.o. female patient presents to office with complaint of arch pain left greater than right and occasional shooting pain along the tops of both feet along the second metatarsal shaft and the bottom of the heels reports that pain is constant for the last 3 months but has gotten a little worse after returning back from [place].  Pain is 7 out of 10 reports that this feels kind of similar to an episode of when she had Planter fasciitis years ago however is a little different because mostly pain or in her arches.  Patient denies any injury or trauma.  Patient denies redness warmth swelling besides occasional swelling since being back from [place] at her ankles.  Review of system noncontributory.  Patient Active Problem List   Diagnosis Date Noted  . Class 3 severe obesity without serious comorbidity with body mass index (BMI) of 40.0 to 44.9 in adult (HCC) 11/12/2020  . Essential hypertension 11/12/2020  . Cyst of right ovary 06/28/2019    Current Outpatient Medications on File Prior to Visit  Medication Sig Dispense Refill  . diclofenac (VOLTAREN) 75 MG EC tablet Take by mouth.    . estradiol (ESTRACE) 1 MG tablet     . bisoprolol-hydrochlorothiazide (ZIAC) 10-6.25 MG tablet Take 1 tablet by mouth 2 (two) times daily.    . cyclobenzaprine (FLEXERIL) 5 MG tablet Take 5 mg by mouth every 8 (eight) hours as needed.    . diclofenac Sodium (VOLTAREN) 1 % GEL APPLY 4 G TOPICALLY 4 (FOUR) TIMES DAILY. 100 g 1  . esomeprazole (NEXIUM) 40 MG capsule Take by mouth daily.    . meloxicam (MOBIC) 15 MG tablet TAKE 1 TABLET BY MOUTH EVERY DAY FOR PAIN    . telmisartan (MICARDIS) 40 MG tablet TAKE 1 (ONE) TABLET DAILY    . TRULICITY 1.5 MG/0.5ML SOPN INJECT 1.5MG  ONCE A WEEK     No current facility-administered medications on file prior to visit.    Allergies  Allergen Reactions  . Levofloxacin Anaphylaxis  . Moxifloxacin Anaphylaxis  . Ciprofloxacin Swelling  . Tramadol  Other (See Comments)    Makes very sleepy    Objective: Physical Exam General: The patient is alert and oriented x3 in no acute distress.  Dermatology: Skin is warm, dry and supple bilateral lower extremities. Nails 1-10 are normal. There is no erythema, edema, no eccymosis, no open lesions present. Integument is otherwise unremarkable.  Vascular: Dorsalis Pedis pulse and Posterior Tibial pulse are 2/4 bilateral. Capillary fill time is immediate to all digits.  Neurological: Grossly intact to light touch bilateral  Musculoskeletal: Tenderness to palpation at medial arches bilateral, there is very minimal pain to the insertion of the plantar fascia on the heels bilateral.  Pes planus foot type.  Minimal diffuse pain dorsal feet bilateral and along the forefoot and second metatarsal shafts bilateral.  No pain with calf compression bilateral. There is decreased Ankle joint range of motion bilateral. All other joints range of motion within normal limits bilateral. Strength 5/5 in all groups bilateral.   Gait: Unassisted  Xray, Right/Left foot:  Normal osseous mineralization. Joint spaces preserved except at midtarsal joint where there is breech supportive of pes planus deformity. No fracture/dislocation/boney destruction.  Minimal calcaneal spur present with mild thickening of plantar fascia. No other soft tissue abnormalities or radiopaque foreign bodies.   Assessment and Plan: Problem List Items Addressed This Visit   None   Visit Diagnoses    Foot arch pain, unspecified  laterality    -  Primary   Plantar fasciitis       Pes planus of both feet       Metatarsalgia of both feet       Foot pain, bilateral          -Complete examination performed.  -Xrays reviewed -Discussed with patient in detail the condition of arch pain with likely early plantar fasciitis with likely compensation pain from increased walk, how this occurs and general treatment options. Explained both conservative  and surgical treatments.  -Recommend patient to try over-the-counter insoles -Advised patient to start taking her diclofenac and if she cannot tolerate this medication as given by orthopedics may benefit from taking prednisone; patient to call office if she desires to start this prescription -Explained and dispensed to patient daily stretching exercises. -Recommend patient to ice affected area 1-2x daily. -Patient to return to office if fails to improve up or sooner if problems or questions arise.  Asencion Islam, DPM

## 2021-04-15 NOTE — Patient Instructions (Addendum)
For tennis shoes recommend:  Brooks Beast Ascis New balance Saucony Can be purchased at [employer] or [employer] Sketchers arch fit Can be purchased at any major retailers  Vionic  SAS Can be purchased at [employer] or [employer]   For work shoes recommend: Sketchers Work Timberland boots  Redwing boots Can be purchased at a variety of places or [employer]   For casual shoes recommend: Vionic  Can be purchased at [employer] or [employer]   For Over the Counter Orthotics recommend: Power Steps Can be purchased in office/[facility] Superfeet Can be purchased at [employer] or [employer] Airplus Can be purchased at [employer]   Plantar Fasciitis Rehab Ask your health care provider which exercises are safe for you. Do exercises exactly as told by your health care provider and adjust them as directed. It is normal to feel mild stretching, pulling, tightness, or discomfort as you do these exercises. Stop right away if you feel sudden pain or your pain gets worse. Do not begin these exercises until told by your health care provider. Stretching and range-of-motion exercises These exercises warm up your muscles and joints and improve the movement and flexibility of your foot. These exercises also help to relieve pain. Plantar fascia stretch 1. Sit with your left / right leg crossed over your opposite knee. 2. Hold your heel with one hand with that thumb near your arch. With your other hand, hold your toes and gently pull them back toward the top of your foot. You should feel a stretch on the base (bottom) of your toes, or the bottom of your foot (plantar fascia), or both. 3. Hold this stretch for__________ seconds. 4. Slowly release your toes and return to the starting position. Repeat __________ times. Complete this exercise __________ times a day.   Gastrocnemius stretch, standing This exercise is also called a calf (gastroc) stretch. It stretches the muscles in the back of the  upper calf. 1. Stand with your hands against a wall. 2. Extend your left / right leg behind you, and bend your front knee slightly. 3. Keeping your heels on the floor, your toes facing forward, and your back knee straight, shift your weight toward the wall. Do not arch your back. You should feel a gentle stretch in your upper calf. 4. Hold this position for __________ seconds. Repeat __________ times. Complete this exercise __________ times a day.   Soleus stretch, standing This exercise is also called a calf (soleus) stretch. It stretches the muscles in the back of the lower calf. 1. Stand with your hands against a wall. 2. Extend your left / right leg behind you, and bend your front knee slightly. 3. Keeping your heels on the floor and your toes facing forward, bend your back knee and shift your weight slightly over your back leg. You should feel a gentle stretch deep in your lower calf. 4. Hold this position for __________ seconds. Repeat __________ times. Complete this exercise __________ times a day. Gastroc and soleus stretch, standing step This exercise stretches the muscles in the back of the lower leg. These muscles are in the upper calf (gastrocnemius) and the lower calf (soleus). 1. Stand with the ball of your left / right foot on the front of a step. The ball of your foot is on the walking surface, right under your toes. 2. Keep your other foot firmly on the same step. 3. Hold on to the wall or a railing for balance. 4. Slowly lift your   other foot, allowing your body weight to press your heel down over the edge of the front of the step. Keep knee straight and unbent. You should feel a stretch in your calf. 5. Hold this position for __________ seconds. 6. Return both feet to the step. 7. Repeat this exercise with a slight bend in your left / right knee. Repeat __________ times with your left / right knee straight and __________ times with your left / right knee bent. Complete this  exercise __________ times a day. Balance exercise This exercise builds your balance and strength control of your arch to help take pressure off your plantar fascia. Single leg stand If this exercise is too easy, you can try it with your eyes closed or while standing on a pillow. 1. Without shoes, stand near a railing or in a doorway. You may hold on to the railing or door frame as needed. 2. Stand on your left / right foot. Keep your big toe down on the floor and lift the arch of your foot. You should feel a stretch across the bottom of your foot and your arch. Do not let your foot roll inward. 3. Hold this position for __________ seconds. Repeat __________ times. Complete this exercise __________ times a day. This information is not intended to replace advice given to you by your health care provider. Make sure you discuss any questions you have with your health care provider. Document Revised: 08/29/2020 Document Reviewed: 08/29/2020 Elsevier Patient Education  2021 [employer]  

## 2021-05-05 DIAGNOSIS — Z6841 Body Mass Index (BMI) 40.0 and over, adult: Secondary | ICD-10-CM | POA: Diagnosis not present

## 2021-05-05 DIAGNOSIS — E1169 Type 2 diabetes mellitus with other specified complication: Secondary | ICD-10-CM | POA: Diagnosis not present

## 2021-05-05 DIAGNOSIS — E669 Obesity, unspecified: Secondary | ICD-10-CM | POA: Diagnosis not present

## 2021-05-05 DIAGNOSIS — B029 Zoster without complications: Secondary | ICD-10-CM | POA: Diagnosis not present

## 2021-07-21 ENCOUNTER — Other Ambulatory Visit: Payer: Self-pay | Admitting: Sports Medicine

## 2021-07-21 ENCOUNTER — Telehealth: Payer: Self-pay

## 2021-07-21 MED ORDER — DICLOFENAC SODIUM 75 MG PO TBEC: 75.0000 mg | DELAYED_RELEASE_TABLET | Freq: Two times a day (BID) | ORAL | 0 refills | Status: DC

## 2021-07-21 NOTE — Progress Notes (Signed)
Sent PO Diclofenac to pharmacy

## 2021-07-21 NOTE — Telephone Encounter (Signed)
Pt states she will like a Rx for voltaren 75 mg BID sent to [facility] on [address]

## 2021-08-15 DIAGNOSIS — L03211 Cellulitis of face: Secondary | ICD-10-CM | POA: Diagnosis not present

## 2021-09-17 DIAGNOSIS — J324 Chronic pansinusitis: Secondary | ICD-10-CM | POA: Diagnosis not present

## 2022-01-12 DIAGNOSIS — Z1231 Encounter for screening mammogram for malignant neoplasm of breast: Secondary | ICD-10-CM | POA: Diagnosis not present

## 2022-01-12 DIAGNOSIS — H11423 Conjunctival edema, bilateral: Secondary | ICD-10-CM | POA: Diagnosis not present

## 2022-01-12 DIAGNOSIS — L209 Atopic dermatitis, unspecified: Secondary | ICD-10-CM | POA: Diagnosis not present

## 2022-04-23 DIAGNOSIS — Z01419 Encounter for gynecological examination (general) (routine) without abnormal findings: Secondary | ICD-10-CM | POA: Diagnosis not present

## 2022-04-23 DIAGNOSIS — Z1231 Encounter for screening mammogram for malignant neoplasm of breast: Secondary | ICD-10-CM | POA: Diagnosis not present

## 2022-04-23 DIAGNOSIS — N951 Menopausal and female climacteric states: Secondary | ICD-10-CM | POA: Diagnosis not present

## 2022-05-02 DIAGNOSIS — J028 Acute pharyngitis due to other specified organisms: Secondary | ICD-10-CM | POA: Diagnosis not present

## 2022-05-02 DIAGNOSIS — Z20822 Contact with and (suspected) exposure to covid-19: Secondary | ICD-10-CM | POA: Diagnosis not present

## 2022-06-11 DIAGNOSIS — M2550 Pain in unspecified joint: Secondary | ICD-10-CM | POA: Diagnosis not present

## 2022-06-11 DIAGNOSIS — E669 Obesity, unspecified: Secondary | ICD-10-CM | POA: Diagnosis not present

## 2022-06-11 DIAGNOSIS — E1169 Type 2 diabetes mellitus with other specified complication: Secondary | ICD-10-CM | POA: Diagnosis not present

## 2022-06-11 DIAGNOSIS — I10 Essential (primary) hypertension: Secondary | ICD-10-CM | POA: Diagnosis not present

## 2022-06-11 DIAGNOSIS — E782 Mixed hyperlipidemia: Secondary | ICD-10-CM | POA: Diagnosis not present

## 2022-09-02 DIAGNOSIS — R300 Dysuria: Secondary | ICD-10-CM | POA: Diagnosis not present

## 2022-09-15 DIAGNOSIS — Z6841 Body Mass Index (BMI) 40.0 and over, adult: Secondary | ICD-10-CM | POA: Diagnosis not present

## 2022-09-15 DIAGNOSIS — J329 Chronic sinusitis, unspecified: Secondary | ICD-10-CM | POA: Diagnosis not present

## 2022-09-15 DIAGNOSIS — F4320 Adjustment disorder, unspecified: Secondary | ICD-10-CM | POA: Diagnosis not present

## 2022-09-15 DIAGNOSIS — J40 Bronchitis, not specified as acute or chronic: Secondary | ICD-10-CM | POA: Diagnosis not present

## 2023-04-08 ENCOUNTER — Encounter: Payer: Self-pay | Admitting: Gastroenterology

## 2023-04-20 ENCOUNTER — Ambulatory Visit (AMBULATORY_SURGERY_CENTER): Payer: Managed Care, Other (non HMO)

## 2023-04-20 VITALS — Ht 66.0 in | Wt 255.0 lb

## 2023-04-20 DIAGNOSIS — Z1211 Encounter for screening for malignant neoplasm of colon: Secondary | ICD-10-CM

## 2023-04-20 MED ORDER — NA SULFATE-K SULFATE-MG SULF 17.5-3.13-1.6 GM/177ML PO SOLN: 1.0000 | Freq: Once | ORAL | 0 refills | Status: AC

## 2023-04-20 NOTE — Progress Notes (Signed)
No egg or soy allergy known to patient  No issues known to pt with past sedation with any surgeries or procedures - PONV  Patient denies ever being told they had issues or difficulty with intubation  No FH of Malignant Hyperthermia Pt is not on diet pills Pt is not on  home 02  Pt is not on blood thinners  Pt denies issues with constipation  No A fib or A flutter Have any cardiac testing pending--no  Pt is ambulatory   Patient's chart reviewed by Cathlyn Parsons CNRA prior to previsit and patient appropriate for the [facility].  Previsit completed and red dot placed by patient's name on their procedure day (on provider's schedule).     PV completed with patient. Prep instructions sent to mailing address. [facility] coupon for [facility] provided .Pt instructed to use Single[employer]com or GoodRx for a price reduction on prep

## 2023-05-14 ENCOUNTER — Encounter: Payer: PRIVATE HEALTH INSURANCE | Admitting: Gastroenterology

## 2023-05-19 ENCOUNTER — Encounter: Payer: Self-pay | Admitting: Gastroenterology

## 2023-06-10 ENCOUNTER — Telehealth: Payer: Self-pay | Admitting: Gastroenterology

## 2023-06-10 NOTE — Telephone Encounter (Signed)
Patient called in to cancel procedure for tomorrow with Dr.Gupta. She stated she have to take her mother to the hospital for some appointments and will call back in a later time to reschedule.

## 2023-06-11 ENCOUNTER — Encounter: Payer: PRIVATE HEALTH INSURANCE | Admitting: Gastroenterology

## 2024-01-26 NOTE — Progress Notes (Signed)
 Office Visit Note  Patient: Annette Beard             Date of Birth: 03-17-1971           MRN: [id number]             PCP: Lise Auer, MD Referring: [facility] Visit Date: 02/09/2024 Occupation: @GUAROCC @  Subjective:  Pain in multiple joints, positive ANA  History of Present Illness: Annette Beard is a 53 y.o. female saltation per request of her PCP for the evaluation of positive ANA.  According the patient she has had joint pain and fatigue since she was in her early 84s.  She states 3 years ago she developed rash all over her body which was treated as ringworm initially.  As her symptoms got worse she was seen at [facility] dermatology 2 years ago and was given the diagnosis of psoriasis.  She states despite using the topical agents her rash got worse.  She was seen at [facility] dermatology in October 2023 and was again given the diagnosis of psoriasis.  She states her symptoms continue to get worse.  She had a follow-up appointment with the dermatologist in October 2024.  Prior to that her labs were positive for ANA.  She states she had 2 different biopsies 1 biopsy came positive for PRP and other for psoriasis.  She was given acitretin about 7 months ago and her rash gradually started improving.  She has some residual rash on her legs now.  She continues to have joint pain which she describes in her neck, lower back, shoulders, hands, hips and her knees.  She states she notices swelling in her ankles and her feet towards the end of the day.  Patient gives history of generalized achiness, muscle cramps, fatigue and brain fog.  She states she gets frequent muscle cramps.  She has had history of recurrent plantar fasciitis for the last 20 years.  There is family history of ulcerative colitis in maternal aunt.  She is gravida 3, para 4.  There is no history of preeclampsia or DVTs.  She is right-handed, she does not drink any alcohol.  She is non-smoker.  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  She walks for exercise.    Activities of Daily Living:  Patient reports morning stiffness for 30 minutes.   Patient Reports nocturnal pain.  Difficulty dressing/grooming: Denies Difficulty climbing stairs: Reports Difficulty getting out of chair: Reports Difficulty using hands for taps, buttons, cutlery, and/or writing: Denies  Review of Systems  Constitutional:  Positive for fatigue.  HENT:  Positive for nose dryness. Negative for mouth sores and mouth dryness.   Eyes:  Positive for dryness.  Respiratory:  Negative for shortness of breath.   Cardiovascular:  Negative for chest pain and palpitations.  Gastrointestinal:  Negative for blood in stool, constipation and diarrhea.  Endocrine: Negative for increased urination.  Genitourinary:  Positive for involuntary urination.  Musculoskeletal:  Positive for joint pain, joint pain, joint swelling, myalgias, morning stiffness and myalgias. Negative for gait problem, muscle weakness and muscle tenderness.  Skin:  Positive for rash and hair loss. Negative for color change and sensitivity to sunlight.  Allergic/Immunologic: Negative for susceptible to infections.  Neurological:  Positive for headaches. Negative for dizziness.  Hematological:  Negative for swollen glands.  Psychiatric/Behavioral:  Negative for depressed mood and sleep disturbance. The patient is not nervous/anxious.     PMFS History:  Patient Active Problem  List   Diagnosis Date Noted   Class 3 severe obesity without serious comorbidity with body mass index (BMI) of 40.0 to 44.9 in adult [facility]) 11/12/2020   Essential hypertension 11/12/2020   Cyst of right ovary 06/28/2019    Past Medical History:  Diagnosis Date   Anemia    Arthritis    GERD (gastroesophageal reflux disease)    Hypertension    Prediabetes    PRP (pityriasis rubra pilaris)     Family History  Problem Relation Age of Onset   Hypertension Mother    Diabetes Sister     Hypertension Sister    Hypertension Sister    Diabetes Brother    Hypertension Brother    Hypertension Brother    Colon cancer Neg Hx    Colon polyps Neg Hx    Rectal cancer Neg Hx    Stomach cancer Neg Hx    Esophageal cancer Neg Hx    Past Surgical History:  Procedure Laterality Date   ABDOMINAL HERNIA REPAIR  2016   CESAREAN SECTION     x3   PARTIAL HYSTERECTOMY  2014   Social History   Social History Narrative   Not on file    There is no immunization history on file for this patient.   Objective: Vital Signs: BP 126/84 (BP Location: Right Arm, Patient Position: Sitting, Cuff Size: Large)   Pulse 68   Resp 14   Ht 5' 5.75" (1.67 m)   Wt 218 lb (98.9 kg)   BMI 35.45 kg/m    Physical Exam Vitals and nursing note reviewed.  Constitutional:      Appearance: She is well-developed.  HENT:     Head: Normocephalic and atraumatic.  Eyes:     Conjunctiva/sclera: Conjunctivae normal.  Cardiovascular:     Rate and Rhythm: Normal rate and regular rhythm.     Heart sounds: Normal heart sounds.  Pulmonary:     Effort: Pulmonary effort is normal.     Breath sounds: Normal breath sounds.  Abdominal:     General: Bowel sounds are normal.     Palpations: Abdomen is soft.  Musculoskeletal:     Cervical back: Normal range of motion.  Lymphadenopathy:     Cervical: No cervical adenopathy.  Skin:    General: Skin is warm and dry.     Capillary Refill: Capillary refill takes less than 2 seconds.  Neurological:     Mental Status: She is alert and oriented to person, place, and time.  Psychiatric:        Behavior: Behavior normal.      Musculoskeletal Exam: Cervical, thoracic or lumbar spine 1 good range of motion.  She discomfort range of motion lumbar spine.  There was no SI joint tenderness.  Shoulder joints with good range of motion with discomfort on range of motion of the right shoulder joint.  Elbow joints and wrist joints in good range of motion.  She had bilateral  PIP and DIP thickening with no synovitis.  Hip joints and knee joints in good range of motion.  No warmth swelling or effusion was noted.  She had no tenderness over plantar fascia.  She had bilateral pes planus.  PIP and DIP thickening was noted.  CDAI Exam: CDAI Score: -- Patient Global: --; Provider Global: -- Swollen: --; Tender: -- Joint Exam 02/09/2024   No joint exam has been documented for this visit   There is currently no information documented on the homunculus. Go to the Rheumatology  activity and complete the homunculus joint exam.  Investigation: No additional findings.  Imaging: No results found.  Recent Labs: No results found for: "WBC", "HGB", "PLT", "NA", "K", "CL", "CO2", "GLUCOSE", "BUN", "CREATININE", "BILITOT", "ALKPHOS", "AST", "ALT", "PROT", "ALBUMIN", "CALCIUM", "GFRAA", "QFTBGOLD", "QFTBGOLDPLUS"  August 09, 2023 CBC WBC 7.7, hemoglobin 12.3, platelets 280, CMP normal.,  TSH normal, RF<10, ANA positive, RNP 1.1, (dsDNA, Smith, SCL 70, SSA, SSB, antichromatin, Jo 1, centromere) negative, ESR 16, CK 95  Speciality Comments: No specialty comments available.  Procedures:  No procedures performed Allergies: Levofloxacin, Moxifloxacin, Ciprofloxacin, and Tramadol   Assessment / Plan:     Visit Diagnoses: Positive ANA (antinuclear antibody) - +ANA, +RNP -patient was found to have positive ANA.  No titer given.  RNP was low titer positive.Patient gives history of fatigue, dry mouth, dry eyes, arthralgias and rash.  Skin biopsy was positive for psoriasis and PRP per patient.  Will get dermatology records.  Plan: Protein / creatinine ratio, urine, C3 and C4, ANA, RNP Antibody  Pain in both hands -she complains of discomfort in the bilateral hands.  No synovitis was noted.  Bilateral PIP and DIP thickening was noted.  Plan: XR Hand 2 View Left, XR Hand 2 View Right, x-rays of bilateral hands were suggestive of osteoarthritis.  A handout on hand exercises was given.   Rheumatoid factor, Cyclic citrul peptide antibody, IgG  Chronic pain of both knees -she complains of discomfort in her knee joints.  No warmth swelling or effusion was noted.  Plan: XR KNEE 3 VIEW RIGHT, XR KNEE 3 VIEW LEFT right knee joint moderate osteoarthritis and chondromalacia patella.  Left knee joint severe osteoarthritis and chondromalacia patella.  Pain in both feet -she complains of discomfort in the bilateral feet.  No synovitis was noted.  PIP and DIP thickening was noted.  Plan: XR Foot 2 Views Right, XR Foot 2 Views Left.  X-rays of bilateral feet were unremarkable.  Pes planus of both feet-use of arch support was advised.  Plantar fasciitis-she gives history of recurrent plantar fasciitis for the last 20 years.  Chronic midline low back pain without sciatica -she complains of lower back pain without any radiculopathy.  She had no SI joint tenderness.  Plan: XR Lumbar Spine 2-3 Views.  Multilevel spondylosis and facet joint arthropathy was noted.  L5-S1 spondylolisthesis was noted.  Mild dextroscoliosis was noted.  Wedging of T11 vertebra was noted.  A handout on back exercises was given.  Psoriasis-no active psoriasis lesions were noted.  Patient states the psoriasis lesions improved over time.  PRP (pityriasis rubra pilaris)-she had the erythematous macular rash on her right thigh.  Patient has been followed by Dr. Karlyn Agee.  She has been using Acitretin which has been effective in the treatment per patient.  Myalgia-she gives history of myalgias.  CK was normal in September.  She had no muscular weakness or tenderness on the examination.  She also gives history of generalized pain for the last 25 years.  She gives history of hyperalgesia and muscle cramps.  Possibility of fibromyalgia syndrome was discussed with the patient.  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  Good sleep hygiene and regular exercise was emphasized.  Benefits of water aerobics and swimming were  discussed.  She would benefit from stretching.  Other fatigue-most likely related to lack of sleep due to her work schedule.  Brain fog-she comp has a brain fog.  Other medical problems are listed as follows:  Gastroesophageal reflux disease  without esophagitis  Primary hypertension-blood pressure was 126/84.  Controlled type 2 diabetes mellitus without complication, without long-term current use of insulin (HCC)  Family history of ulcerative colitis-maternal aunt  Orders: Orders Placed This Encounter  Procedures   XR Hand 2 View Left   XR Hand 2 View Right   XR KNEE 3 VIEW RIGHT   XR KNEE 3 VIEW LEFT   XR Foot 2 Views Right   XR Foot 2 Views Left   XR Lumbar Spine 2-3 Views   Protein / creatinine ratio, urine   C3 and C4   Rheumatoid factor   Cyclic citrul peptide antibody, IgG   ANA   RNP Antibody   No orders of the defined types were placed in this encounter.   Face-to-face time spent with patient was over 50 minutes. Greater than 50% of time was spent in counseling and coordination of care.  Follow-Up Instructions: Return for Polyarthralgia, positive ANA.   Pollyann Savoy, MD  Note - This record has been created using Animal nutritionist.  Chart creation errors have been sought, but may not always  have been located. Such creation errors do not reflect on  the standard of medical care.

## 2024-02-09 ENCOUNTER — Ambulatory Visit (INDEPENDENT_AMBULATORY_CARE_PROVIDER_SITE_OTHER)

## 2024-02-09 ENCOUNTER — Ambulatory Visit

## 2024-02-09 ENCOUNTER — Ambulatory Visit: Payer: Managed Care, Other (non HMO) | Attending: Rheumatology | Admitting: Rheumatology

## 2024-02-09 ENCOUNTER — Encounter: Payer: Self-pay | Admitting: Rheumatology

## 2024-02-09 VITALS — BP 126/84 | HR 68 | Resp 14 | Ht 65.75 in | Wt 218.0 lb

## 2024-02-09 DIAGNOSIS — G8929 Other chronic pain: Secondary | ICD-10-CM

## 2024-02-09 DIAGNOSIS — M79641 Pain in right hand: Secondary | ICD-10-CM | POA: Diagnosis not present

## 2024-02-09 DIAGNOSIS — M79671 Pain in right foot: Secondary | ICD-10-CM

## 2024-02-09 DIAGNOSIS — M2141 Flat foot [pes planus] (acquired), right foot: Secondary | ICD-10-CM

## 2024-02-09 DIAGNOSIS — M722 Plantar fascial fibromatosis: Secondary | ICD-10-CM

## 2024-02-09 DIAGNOSIS — B354 Tinea corporis: Secondary | ICD-10-CM

## 2024-02-09 DIAGNOSIS — L409 Psoriasis, unspecified: Secondary | ICD-10-CM

## 2024-02-09 DIAGNOSIS — M25562 Pain in left knee: Secondary | ICD-10-CM

## 2024-02-09 DIAGNOSIS — M79672 Pain in left foot: Secondary | ICD-10-CM

## 2024-02-09 DIAGNOSIS — R768 Other specified abnormal immunological findings in serum: Secondary | ICD-10-CM | POA: Diagnosis not present

## 2024-02-09 DIAGNOSIS — M2142 Flat foot [pes planus] (acquired), left foot: Secondary | ICD-10-CM

## 2024-02-09 DIAGNOSIS — Z8379 Family history of other diseases of the digestive system: Secondary | ICD-10-CM

## 2024-02-09 DIAGNOSIS — M79642 Pain in left hand: Secondary | ICD-10-CM | POA: Diagnosis not present

## 2024-02-09 DIAGNOSIS — R5383 Other fatigue: Secondary | ICD-10-CM

## 2024-02-09 DIAGNOSIS — I10 Essential (primary) hypertension: Secondary | ICD-10-CM

## 2024-02-09 DIAGNOSIS — R4189 Other symptoms and signs involving cognitive functions and awareness: Secondary | ICD-10-CM

## 2024-02-09 DIAGNOSIS — M545 Low back pain, unspecified: Secondary | ICD-10-CM

## 2024-02-09 DIAGNOSIS — M25561 Pain in right knee: Secondary | ICD-10-CM | POA: Diagnosis not present

## 2024-02-09 DIAGNOSIS — K219 Gastro-esophageal reflux disease without esophagitis: Secondary | ICD-10-CM

## 2024-02-09 DIAGNOSIS — L440 Pityriasis rubra pilaris: Secondary | ICD-10-CM

## 2024-02-09 DIAGNOSIS — E119 Type 2 diabetes mellitus without complications: Secondary | ICD-10-CM

## 2024-02-09 DIAGNOSIS — J302 Other seasonal allergic rhinitis: Secondary | ICD-10-CM

## 2024-02-09 DIAGNOSIS — M791 Myalgia, unspecified site: Secondary | ICD-10-CM

## 2024-02-09 NOTE — Patient Instructions (Addendum)
 Hand Exercises Hand exercises can be helpful for almost anyone. They can strengthen your hands and improve flexibility and movement. The exercises can also increase blood flow to the hands. These results can make your work and daily tasks easier for you. Hand exercises can be especially helpful for people who have joint pain from arthritis or nerve damage from using their hands over and over. These exercises can also help people who injure a hand. Exercises Most of these hand exercises are gentle stretching and motion exercises. It is usually safe to do them often throughout the day. Warming up your hands before exercise may help reduce stiffness. You can do this with gentle massage or by placing your hands in warm water for 10-15 minutes. It is normal to feel some stretching, pulling, tightness, or mild discomfort when you begin new exercises. In time, this will improve. Remember to always be careful and stop right away if you feel sudden, very bad pain or your pain gets worse. You want to get better and be safe. Ask your health care provider which exercises are safe for you. Do exercises exactly as told by your provider and adjust them as told. Do not begin these exercises until told by your provider. Knuckle bend or "claw" fist  Stand or sit with your arm, hand, and all five fingers pointed straight up. Make sure to keep your wrist straight. Gently bend your fingers down toward your palm until the tips of your fingers are touching your palm. Keep your big knuckle straight and only bend the small knuckles in your fingers. Hold this position for 10 seconds. Straighten your fingers back to your starting position. Repeat this exercise 5-10 times with each hand. Full finger fist  Stand or sit with your arm, hand, and all five fingers pointed straight up. Make sure to keep your wrist straight. Gently bend your fingers into your palm until the tips of your fingers are touching the middle of your  palm. Hold this position for 10 seconds. Extend your fingers back to your starting position, stretching every joint fully. Repeat this exercise 5-10 times with each hand. Straight fist  Stand or sit with your arm, hand, and all five fingers pointed straight up. Make sure to keep your wrist straight. Gently bend your fingers at the big knuckle, where your fingers meet your hand, and at the middle knuckle. Keep the knuckle at the tips of your fingers straight and try to touch the bottom of your palm. Hold this position for 10 seconds. Extend your fingers back to your starting position, stretching every joint fully. Repeat this exercise 5-10 times with each hand. Tabletop  Stand or sit with your arm, hand, and all five fingers pointed straight up. Make sure to keep your wrist straight. Gently bend your fingers at the big knuckle, where your fingers meet your hand, as far down as you can. Keep the small knuckles in your fingers straight. Think of forming a tabletop with your fingers. Hold this position for 10 seconds. Extend your fingers back to your starting position, stretching every joint fully. Repeat this exercise 5-10 times with each hand. Finger spread  Place your hand flat on a table with your palm facing down. Make sure your wrist stays straight. Spread your fingers and thumb apart from each other as far as you can until you feel a gentle stretch. Hold this position for 10 seconds. Bring your fingers and thumb tight together again. Hold this position for 10 seconds. Repeat  this exercise 5-10 times with each hand. Making circles  Stand or sit with your arm, hand, and all five fingers pointed straight up. Make sure to keep your wrist straight. Make a circle by touching the tip of your thumb to the tip of your index finger. Hold for 10 seconds. Then open your hand wide. Repeat this motion with your thumb and each of your fingers. Repeat this exercise 5-10 times with each hand. Thumb  motion  Sit with your forearm resting on a table and your wrist straight. Your thumb should be facing up toward the ceiling. Keep your fingers relaxed as you move your thumb. Lift your thumb up as high as you can toward the ceiling. Hold for 10 seconds. Bend your thumb across your palm as far as you can, reaching the tip of your thumb for the small finger (pinkie) side of your palm. Hold for 10 seconds. Repeat this exercise 5-10 times with each hand. Grip strengthening  Hold a stress ball or other soft ball in the middle of your hand. Slowly increase the pressure, squeezing the ball as much as you can without causing pain. Think of bringing the tips of your fingers into the middle of your palm. All of your finger joints should bend when doing this exercise. Hold your squeeze for 10 seconds, then relax. Repeat this exercise 5-10 times with each hand. Contact a health care provider if: Your hand pain or discomfort gets much worse when you do an exercise. Your hand pain or discomfort does not improve within 2 hours after you exercise. If you have either of these problems, stop doing these exercises right away. Do not do them again unless your provider says that you can. Get help right away if: You develop sudden, severe hand pain or swelling. If this happens, stop doing these exercises right away. Do not do them again unless your provider says that you can. This information is not intended to replace advice given to you by your health care provider. Make sure you discuss any questions you have with your health care provider. Document Revised: 12/01/2022 Document Reviewed: 12/01/2022 [employer] Patient Education  2024 [employer] Low Back Sprain or Strain Rehab Ask your health care provider which exercises are safe for you. Do exercises exactly as told by your health care provider and adjust them as directed. It is normal to feel mild stretching, pulling, tightness, or discomfort as you do these  exercises. Stop right away if you feel sudden pain or your pain gets worse. Do not begin these exercises until told by your health care provider. Stretching and range-of-motion exercises These exercises warm up your muscles and joints and improve the movement and flexibility of your back. These exercises also help to relieve pain, numbness, and tingling. Lumbar rotation  Lie on your back on a firm bed or the floor with your knees bent. Straighten your arms out to your sides so each arm forms a 90-degree angle (right angle) with a side of your body. Slowly move (rotate) both of your knees to one side of your body until you feel a stretch in your lower back (lumbar). Try not to let your shoulders lift off the floor. Hold this position for __________ seconds. Tense your abdominal muscles and slowly move your knees back to the starting position. Repeat this exercise on the other side of your body. Repeat __________ times. Complete this exercise __________ times a day. Single knee to chest  Lie on your back on a  firm bed or the floor with both legs straight. Bend one of your knees. Use your hands to move your knee up toward your chest until you feel a gentle stretch in your lower back and buttock. Hold your leg in this position by holding on to the front of your knee. Keep your other leg as straight as possible. Hold this position for __________ seconds. Slowly return to the starting position. Repeat with your other leg. Repeat __________ times. Complete this exercise __________ times a day. Prone extension on elbows  Lie on your abdomen on a firm bed or the floor (prone position). Prop yourself up on your elbows. Use your arms to help lift your chest up until you feel a gentle stretch in your abdomen and your lower back. This will place some of your body weight on your elbows. If this is uncomfortable, try stacking pillows under your chest. Your hips should stay down, against the surface that  you are lying on. Keep your hip and back muscles relaxed. Hold this position for __________ seconds. Slowly relax your upper body and return to the starting position. Repeat __________ times. Complete this exercise __________ times a day. Strengthening exercises These exercises build strength and endurance in your back. Endurance is the ability to use your muscles for a long time, even after they get tired. Pelvic tilt This exercise strengthens the muscles that lie deep in the abdomen. Lie on your back on a firm bed or the floor with your legs extended. Bend your knees so they are pointing toward the ceiling and your feet are flat on the floor. Tighten your lower abdominal muscles to press your lower back against the floor. This motion will tilt your pelvis so your tailbone points up toward the ceiling instead of pointing to your feet or the floor. To help with this exercise, you may place a small towel under your lower back and try to push your back into the towel. Hold this position for __________ seconds. Let your muscles relax completely before you repeat this exercise. Repeat __________ times. Complete this exercise __________ times a day. Alternating arm and leg raises  Get on your hands and knees on a firm surface. If you are on a hard floor, you may want to use padding, such as an exercise mat, to cushion your knees. Line up your arms and legs. Your hands should be directly below your shoulders, and your knees should be directly below your hips. Lift your left leg behind you. At the same time, raise your right arm and straighten it in front of you. Do not lift your leg higher than your hip. Do not lift your arm higher than your shoulder. Keep your abdominal and back muscles tight. Keep your hips facing the ground. Do not arch your back. Keep your balance carefully, and do not hold your breath. Hold this position for __________ seconds. Slowly return to the starting  position. Repeat with your right leg and your left arm. Repeat __________ times. Complete this exercise __________ times a day. Abdominal set with straight leg raise  Lie on your back on a firm bed or the floor. Bend one of your knees and keep your other leg straight. Tense your abdominal muscles and lift your straight leg up, 4-6 inches (10-15 cm) off the ground. Keep your abdominal muscles tight and hold this position for __________ seconds. Do not hold your breath. Do not arch your back. Keep it flat against the ground. Keep your abdominal muscles  tense as you slowly lower your leg back to the starting position. Repeat with your other leg. Repeat __________ times. Complete this exercise __________ times a day. Single leg lower with bent knees Lie on your back on a firm bed or the floor. Tense your abdominal muscles and lift your feet off the floor, one foot at a time, so your knees and hips are bent in 90-degree angles (right angles). Your knees should be over your hips and your lower legs should be parallel to the floor. Keeping your abdominal muscles tense and your knee bent, slowly lower one of your legs so your toe touches the ground. Lift your leg back up to return to the starting position. Do not hold your breath. Do not let your back arch. Keep your back flat against the ground. Repeat with your other leg. Repeat __________ times. Complete this exercise __________ times a day. Posture and body mechanics Good posture and healthy body mechanics can help to relieve stress in your body's tissues and joints. Body mechanics refers to the movements and positions of your body while you do your daily activities. Posture is part of body mechanics. Good posture means: Your spine is in its natural S-curve position (neutral). Your shoulders are pulled back slightly. Your head is not tipped forward (neutral). Follow these guidelines to improve your posture and body mechanics in your everyday  activities. Standing  When standing, keep your spine neutral and your feet about hip-width apart. Keep a slight bend in your knees. Your ears, shoulders, and hips should line up. When you do a task in which you stand in one place for a long time, place one foot up on a stable object that is 2-4 inches (5-10 cm) high, such as a footstool. This helps keep your spine neutral. Sitting  When sitting, keep your spine neutral and keep your feet flat on the floor. Use a footrest, if necessary, and keep your thighs parallel to the floor. Avoid rounding your shoulders, and avoid tilting your head forward. When working at a desk or a computer, keep your desk at a height where your hands are slightly lower than your elbows. Slide your chair under your desk so you are close enough to maintain good posture. When working at a computer, place your monitor at a height where you are looking straight ahead and you do not have to tilt your head forward or downward to look at the screen. Resting When lying down and resting, avoid positions that are most painful for you. If you have pain with activities such as sitting, bending, stooping, or squatting, lie in a position in which your body does not bend very much. For example, avoid curling up on your side with your arms and knees near your chest (fetal position). If you have pain with activities such as standing for a long time or reaching with your arms, lie with your spine in a neutral position and bend your knees slightly. Try the following positions: Lying on your side with a pillow between your knees. Lying on your back with a pillow under your knees. Lifting  When lifting objects, keep your feet at least shoulder-width apart and tighten your abdominal muscles. Bend your knees and hips and keep your spine neutral. It is important to lift using the strength of your legs, not your back. Do not lock your knees straight out. Always ask for help to lift heavy or  awkward objects. This information is not intended to replace advice given  to you by your health care provider. Make sure you discuss any questions you have with your health care provider. Document Revised: 03/22/2023 Document Reviewed: 02/03/2021 [employer] Patient Education  2024 [employer].

## 2024-02-11 LAB — ANTI-NUCLEAR AB-TITER (ANA TITER): ANA Titer 1: 1:320 {titer} — ABNORMAL HIGH

## 2024-02-11 LAB — RNP ANTIBODY: Ribonucleic Protein(ENA) Antibody, IgG: 1 AI — AB

## 2024-02-11 LAB — PROTEIN / CREATININE RATIO, URINE
Creatinine, Urine: 25 mg/dL (ref 20–275)
Total Protein, Urine: <4 mg/dL — ABNORMAL LOW (ref 5–24)

## 2024-02-11 LAB — ANA: Anti Nuclear Antibody (ANA): POSITIVE — AB

## 2024-02-11 LAB — CYCLIC CITRUL PEPTIDE ANTIBODY, IGG: Cyclic Citrullin Peptide Ab: <16 U

## 2024-02-11 LAB — C3 AND C4
C3 Complement: 173 mg/dL (ref 83–193)
C4 Complement: 21 mg/dL (ref 15–57)

## 2024-02-11 LAB — RHEUMATOID FACTOR: Rheumatoid fact SerPl-aCnc: <10 [IU]/mL (ref <14)

## 2024-02-14 NOTE — Progress Notes (Signed)
 Urine protein creatinine ratio normal, ANA positive, RNP positive, complements normal rheumatoid factor negative, anti-CCP negative.  I will discuss results at the follow-up visit.

## 2024-02-15 NOTE — Progress Notes (Signed)
 Office Visit Note  Patient: Annette Beard             Date of Birth: Dec 21, 1970           MRN: [id number]             PCP: Lise Auer, MD Referring: Lise Auer, MD Visit Date: 02/29/2024 Occupation: @GUAROCC @  Subjective:  Pain in multiple joints  History of Present Illness: Annette Beard is a 53 y.o. female with past Ro ANA, positive RNP, osteoarthritis and degenerative disc disease.  She returns today after her initial visit on March 2025.  She states about 1 week ago she had bilateral knee joint cortisone injections by her PCP prior to her trip to [place].  She states while she was on the trip she fell and bruised her both knees.  She reports no relief from cortisone injections.  She continues to have pain and discomfort in her both hands, knee joints and her lower back.  She continues to have generalized aches and pains and morning stiffness.  She complains of dry mouth and dry eyes.  There is no history of malar rash, photosensitivity or Raynaud's phenomenon.  He denies shortness of breath or palpitations.    Activities of Daily Living:  Patient reports morning stiffness for 30 minutes.   Patient Reports nocturnal pain.  Difficulty dressing/grooming: Denies Difficulty climbing stairs: Reports Difficulty getting out of chair: Denies Difficulty using hands for taps, buttons, cutlery, and/or writing: Denies  Review of Systems  Constitutional:  Positive for fatigue.  HENT:  Positive for mouth dryness. Negative for mouth sores.   Eyes:  Positive for dryness.  Respiratory:  Negative for shortness of breath.   Cardiovascular:  Negative for chest pain and palpitations.  Gastrointestinal:  Negative for blood in stool, constipation and diarrhea.  Endocrine: Negative for increased urination.  Genitourinary:  Negative for involuntary urination.  Musculoskeletal:  Positive for joint pain, gait problem, joint pain, myalgias, morning stiffness, muscle tenderness and myalgias. Negative  for joint swelling and muscle weakness.  Skin:  Positive for rash and hair loss. Negative for color change and sensitivity to sunlight.  Allergic/Immunologic: Negative for susceptible to infections.  Neurological:  Positive for headaches. Negative for dizziness.  Hematological:  Negative for swollen glands.  Psychiatric/Behavioral:  Positive for sleep disturbance. Negative for depressed mood. The patient is not nervous/anxious.     PMFS History:  Patient Active Problem List   Diagnosis Date Noted   Class 3 severe obesity without serious comorbidity with body mass index (BMI) of 40.0 to 44.9 in adult [facility]) 11/12/2020   Essential hypertension 11/12/2020   Cyst of right ovary 06/28/2019    Past Medical History:  Diagnosis Date   Anemia    Arthritis    GERD (gastroesophageal reflux disease)    Hypertension    Prediabetes    PRP (pityriasis rubra pilaris)     Family History  Problem Relation Age of Onset   Hypertension Mother    Diabetes Sister    Hypertension Sister    Hypertension Sister    Diabetes Brother    Hypertension Brother    Hypertension Brother    Colon cancer Neg Hx    Colon polyps Neg Hx    Rectal cancer Neg Hx    Stomach cancer Neg Hx    Esophageal cancer Neg Hx    Past Surgical History:  Procedure Laterality Date   ABDOMINAL HERNIA REPAIR  2016   CESAREAN SECTION  x3   PARTIAL HYSTERECTOMY  2014   Social History   Social History Narrative   Not on file    There is no immunization history on file for this patient.   Objective: Vital Signs: BP 120/82 (BP Location: Left Arm, Patient Position: Sitting, Cuff Size: Large)   Pulse 65   Resp 15   Ht 5\' 6"  (1.676 m)   Wt 215 lb 12.8 oz (97.9 kg)   BMI 34.83 kg/m    Physical Exam Vitals and nursing note reviewed.  Constitutional:      Appearance: She is well-developed.  HENT:     Head: Normocephalic and atraumatic.  Eyes:     Conjunctiva/sclera: Conjunctivae normal.  Cardiovascular:     Rate  and Rhythm: Normal rate and regular rhythm.     Heart sounds: Normal heart sounds.  Pulmonary:     Effort: Pulmonary effort is normal.     Breath sounds: Normal breath sounds.  Abdominal:     General: Bowel sounds are normal.     Palpations: Abdomen is soft.  Musculoskeletal:     Cervical back: Normal range of motion.  Lymphadenopathy:     Cervical: No cervical adenopathy.  Skin:    General: Skin is warm and dry.     Capillary Refill: Capillary refill takes less than 2 seconds.     Comments: Abrasions were noted over her elbows, palm of her hands and over her knees.  Neurological:     Mental Status: She is alert and oriented to person, place, and time.  Psychiatric:        Behavior: Behavior normal.      Musculoskeletal Exam: Cervical spine was in good range of motion.  She discomfort range of motion of lumbar spine.  Shoulders, elbows, wrist joints, MCPs PIPs and DIPs with good range of motion.  PIP and DIP thickening was noted.  Hip joints and knee joints were in good range of motion.  There was no warmth swelling or effusion in her knee joints.  There was no tenderness over ankles or MTPs.  CDAI Exam: CDAI Score: -- Patient Global: --; Provider Global: -- Swollen: --; Tender: -- Joint Exam 02/29/2024   No joint exam has been documented for this visit   There is currently no information documented on the homunculus. Go to the Rheumatology activity and complete the homunculus joint exam.  Investigation: No additional findings.  Imaging: XR Lumbar Spine 2-3 Views Result Date: 02/09/2024 Mild levoscoliosis was noted.  Multilevel spondylosis with L5-S1 spondylolisthesis and facet joint arthropathy was noted.  Wedging of T11 vertebra was noted. Impression: These findings suggestive of multilevel spondylosis and facet joint arthropathy of the lumbar spine.  XR Foot 2 Views Left Result Date: 02/09/2024 No MTP, PIP or DIP narrowing was noted.  No intertarsal, tibiotalar or  subtalar joint space narrowing was noted.  No erosive changes were noted. Impression: Unremarkable x-rays of the foot.  XR Foot 2 Views Right Result Date: 02/09/2024 No MTP, PIP or DIP narrowing was noted.  No intertarsal, tibiotalar or subtalar joint space narrowing was noted.  Inferior calcaneal spur was noted. Impression: Unremarkable x-rays of the foot except for inferior calcaneal spur.  XR KNEE 3 VIEW LEFT Result Date: 02/09/2024 Severe medial compartment narrowing was noted.  Severe patellofemoral narrowing was noted.  No chondrocalcinosis was noted. Impression: These findings are suggestive of severe osteoarthritis and severe chondromalacia patella.  XR KNEE 3 VIEW RIGHT Result Date: 02/09/2024 Moderate medial compartment narrowing was noted.  Moderate to patellofemoral narrowing was noted.  No chondrocalcinosis was noted. Impression: These findings suggestive of moderate osteoarthritis and moderate chondromalacia patella.  XR Hand 2 View Left Result Date: 02/09/2024 CMC, PIP and DIP narrowing was noted.  No MCP, intercarpal or radiocarpal joint space narrowing was noted.  No erosive changes were noted. Impression: These findings are suggestive of osteoarthritis of the hand.  XR Hand 2 View Right Result Date: 02/09/2024 CMC, PIP and DIP narrowing was noted.  No MCP, intercarpal or radiocarpal joint space narrowing was noted.  No erosive changes were noted. Impression: These findings are suggestive of osteoarthritis of the hand.   Recent Labs: No results found for: "WBC", "HGB", "PLT", "NA", "K", "CL", "CO2", "GLUCOSE", "BUN", "CREATININE", "BILITOT", "ALKPHOS", "AST", "ALT", "PROT", "ALBUMIN", "CALCIUM", "GFRAA", "QFTBGOLD", "QFTBGOLDPLUS"   February 09, 2024 urine pro/cr ratio normal, C3-C4 normal, ANA 1: 320 nucleolar, RNP 1.0, RF negative, anti-CCP negative  Speciality Comments: No specialty comments available.  Procedures:  No procedures performed Allergies: Levofloxacin,  Moxifloxacin, Ciprofloxacin, and Tramadol   Assessment / Plan:     Visit Diagnoses: Positive ANA (antinuclear antibody) - Positive ANA positive RNP, fatigue, dry mouth, dry eyes, arthralgias, rash. -Dry mouth and dry eyes could be related to the medication use.  She gives history of arthralgias but no synovitis was noted.  There is no history of inflammatory arthritis.  She gives history of intermittent rash.  There is no rash noted on the examination.  There was no malar rash.  There is no history of Raynaud's phenomenon.  I would like to repeat labs in 6 months.  I advised her to contact me if she develops any new symptoms.  Plan: CBC with Differential/Platelet, Comprehensive metabolic panel with GFR, Urinalysis, Routine w reflex microscopic, Anti-DNA antibody, double-stranded, C3 and C4, ANA, RNP Antibody  Primary osteoarthritis of both hands -she continues to have discomfort in her both hands.  No synovitis was noted.  Radiographic findings suggestive of osteoarthritis.  X-ray of the also discussed with the patient.  Joint protection muscle strengthening was discussed.  A handout on hand exercises was given.  Primary osteoarthritis of both knees -patient reports having cortisone injections to her knee joints last week.  She states she fell while she was on vacation and she had abrasions on her bilateral knee joints today.  X-rays obtained last visit showed left knee severe osteoarthritis and severe chondromalacia patella, right knee moderate osteoarthritis and moderate chondromalacia patella.  X-ray findings were reviewed with the patient.  She is not ready for total knee replacement.  She reports no relief from cortisone injections.  I advised her to give it some time.  If she has ongoing discomfort and inadequate response to cortisone injection we may consider viscosupplement injections in the future.  A handout on knee joint exercises was given.  Pain in both feet -she complains of discomfort in her  feet.  X-rays were unremarkable.  Inferior calcaneal spur was noted in the right foot.  X-ray findings were reviewed with the patient.  Pes planus of both feet-shoes with arch support were advised.  Plantar fasciitis - History of recurrent plantar fasciitis for the last 20 years.  Lumbar spondylosis -she has chronic discomfort in her lower back.  X-rays showed multilevel spondylosis and facet joint arthropathy.  L5-S1 spondylolisthesis and dextroscoliosis were noted.  Wedging of T11 vertebra.  X-ray findings were reviewed with the patient.  I offered physical therapy which she declined.  A handout on back exercises was given.  Psoriasis - Improved per patient.  Other medical problems are listed as follows:  PRP (pityriasis rubra pilaris) - Erythematous macular rash on the right thigh.  Patient follows by Dr. Donavan Burnet.  She is on acitretin.  Myalgia - Generalized pain, hyperalgesia for last 25 years.  Brain fog  Other fatigue  Primary hypertension  Gastroesophageal reflux disease without esophagitis  Controlled type 2 diabetes mellitus without complication, without long-term current use of insulin (HCC)  Family history of ulcerative colitis-maternal aunt  Orders: Orders Placed This Encounter  Procedures   CBC with Differential/Platelet   Comprehensive metabolic panel with GFR   Urinalysis, Routine w reflex microscopic   Anti-DNA antibody, double-stranded   C3 and C4   ANA   RNP Antibody   No orders of the defined types were placed in this encounter.    Follow-Up Instructions: Return in about 6 months (around 08/30/2024) for +ANA, +RNP, OA.   Annette Savoy, MD  Note - This record has been created using Animal nutritionist.  Chart creation errors have been sought, but may not always  have been located. Such creation errors do not reflect on  the standard of medical care.

## 2024-02-29 ENCOUNTER — Ambulatory Visit: Attending: Rheumatology | Admitting: Rheumatology

## 2024-02-29 ENCOUNTER — Encounter: Payer: Self-pay | Admitting: Rheumatology

## 2024-02-29 VITALS — BP 120/82 | HR 65 | Resp 15 | Ht 66.0 in | Wt 215.8 lb

## 2024-02-29 DIAGNOSIS — M170 Bilateral primary osteoarthritis of knee: Secondary | ICD-10-CM

## 2024-02-29 DIAGNOSIS — M722 Plantar fascial fibromatosis: Secondary | ICD-10-CM

## 2024-02-29 DIAGNOSIS — R768 Other specified abnormal immunological findings in serum: Secondary | ICD-10-CM

## 2024-02-29 DIAGNOSIS — L440 Pityriasis rubra pilaris: Secondary | ICD-10-CM

## 2024-02-29 DIAGNOSIS — M47816 Spondylosis without myelopathy or radiculopathy, lumbar region: Secondary | ICD-10-CM

## 2024-02-29 DIAGNOSIS — M2141 Flat foot [pes planus] (acquired), right foot: Secondary | ICD-10-CM

## 2024-02-29 DIAGNOSIS — L409 Psoriasis, unspecified: Secondary | ICD-10-CM

## 2024-02-29 DIAGNOSIS — M2142 Flat foot [pes planus] (acquired), left foot: Secondary | ICD-10-CM

## 2024-02-29 DIAGNOSIS — R5383 Other fatigue: Secondary | ICD-10-CM

## 2024-02-29 DIAGNOSIS — M79671 Pain in right foot: Secondary | ICD-10-CM | POA: Diagnosis not present

## 2024-02-29 DIAGNOSIS — M19042 Primary osteoarthritis, left hand: Secondary | ICD-10-CM

## 2024-02-29 DIAGNOSIS — R4189 Other symptoms and signs involving cognitive functions and awareness: Secondary | ICD-10-CM

## 2024-02-29 DIAGNOSIS — M19041 Primary osteoarthritis, right hand: Secondary | ICD-10-CM

## 2024-02-29 DIAGNOSIS — Z8379 Family history of other diseases of the digestive system: Secondary | ICD-10-CM

## 2024-02-29 DIAGNOSIS — M791 Myalgia, unspecified site: Secondary | ICD-10-CM

## 2024-02-29 DIAGNOSIS — E119 Type 2 diabetes mellitus without complications: Secondary | ICD-10-CM

## 2024-02-29 DIAGNOSIS — M79672 Pain in left foot: Secondary | ICD-10-CM

## 2024-02-29 DIAGNOSIS — I10 Essential (primary) hypertension: Secondary | ICD-10-CM

## 2024-02-29 DIAGNOSIS — K219 Gastro-esophageal reflux disease without esophagitis: Secondary | ICD-10-CM

## 2024-02-29 NOTE — Patient Instructions (Addendum)
 Exercises for Chronic Knee Pain Chronic knee pain is pain that lasts longer than 3 months. For most people with chronic knee pain, exercise and weight loss is an important part of treatment. Your health care provider may want you to focus on: Making the muscles that support your knee stronger. This can take pressure off your knee and reduce pain. Preventing knee stiffness. How far you can move your knee, keeping it there or making it farther. Losing weight (if this applies) to take pressure off your knee, lower your risk for injury, and make it easier for you to exercise. Your provider will help you make an exercise program that fits your needs and physical abilities. Below are simple, low-impact exercises you can do at home. Ask your provider or physical therapist how often you should do your exercise program and how many times to repeat each exercise. General safety tips  Get your provider's approval before doing any exercises. Start slowly and stop any time you feel pain. Do not exercise if your knee pain is flaring up. Warm up first. Stretching a cold muscle can cause an injury. Do 5-10 minutes of easy movement or light stretching before beginning your exercises. Do 5-10 minutes of low-impact activity (like walking or cycling) before starting strengthening exercises. Contact your provider any time you have pain during or after exercising. Exercise can cause discomfort but should not be painful. It is normal to be a little stiff or sore after exercising. Stretching and range-of-motion exercises Front thigh stretch  Stand up straight and support your body by holding on to a chair or resting one hand on a wall. With your legs straight and close together, bend one knee to lift your heel up toward your butt. Using one hand for support, grab your ankle with your free hand. Pull your foot up closer toward your butt to feel the stretch in front of your thigh. Hold the stretch for 30  seconds. Repeat __________ times. Complete this exercise __________ times a day. Back thigh stretch  Sit on the floor with your back straight and your legs out straight in front of you. Place the palms of your hands on the floor and slide them toward your feet as you bend at the hip. Try to touch your nose to your knees and feel the stretch in the back of your thighs. Hold for 30 seconds. Repeat __________ times. Complete this exercise __________ times a day. Calf stretch  Stand facing a wall. Place the palms of your hands flat against the wall, arms extended, and lean slightly against the wall. Get into a lunge position with one leg bent at the knee and the other leg stretched out straight behind you. Keep both feet facing the wall and increase the bend in your knee while keeping the heel of the other leg flat on the ground. You should feel the stretch in your calf. Hold for 30 seconds. Repeat __________ times. Complete this exercise __________ times a day. Strengthening exercises Straight leg lift  Lie on your back with one knee bent and the other leg out straight. Slowly lift the straight leg without bending the knee. Lift until your foot is about 12 inches (30 cm) off the floor. Hold for 3-5 seconds and slowly lower your leg. Repeat __________ times. Complete this exercise __________ times a day. Single leg dip  Stand between two chairs and put both hands on the backs of the chairs for support. Extend one leg out straight with your body  weight resting on the heel of the standing leg. Slowly bend your standing knee to dip your body to the level that is comfortable for you. Hold for 3-5 seconds. Repeat __________ times. Complete this exercise __________ times a day. Hamstring curls  Stand straight, knees close together, facing the back of a chair. Hold on to the back of a chair with both hands. Keep one leg straight. Bend the other knee while bringing the heel up toward the butt  until the knee is bent at a 90-degree angle (right angle). Hold for 3-5 seconds. Repeat __________ times. Complete this exercise __________ times a day. Wall squat  Stand straight with your back, hips, and head against a wall. Step forward one foot at a time with your back still against the wall. Your feet should be 2 feet (61 cm) from the wall at shoulder width. Keeping your back, hips, and head against the wall, slide down the wall to as close to a sitting position as you can get. Hold for 5-10 seconds, then slowly slide back up. Repeat __________ times. Complete this exercise __________ times a day. Step-ups  Stand in front of a sturdy platform or stool that is about 6 inches (15 cm) high. Slowly step up with your left / right foot, keeping your knee in line with your hip and foot. Do not let your knee bend so far that you cannot see your toes. Hold on to a chair for balance, but do not use it for support. Slowly unlock your knee and lower yourself to the starting position. Repeat __________ times. Complete this exercise __________ times a day. Contact a health care provider if: Your exercises cause pain. Your pain is worse after you exercise. Your pain prevents you from doing your exercises. This information is not intended to replace advice given to you by your health care provider. Make sure you discuss any questions you have with your health care provider. Document Revised: 12/01/2022 Document Reviewed: 12/01/2022 [employer] Patient Education  2024 [employer]  Hand Exercises Hand exercises can be helpful for almost anyone. They can strengthen your hands and improve flexibility and movement. The exercises can also increase blood flow to the hands. These results can make your work and daily tasks easier for you. Hand exercises can be especially helpful for people who have joint pain from arthritis or nerve damage from using their hands over and over. These exercises can also help  people who injure a hand. Exercises Most of these hand exercises are gentle stretching and motion exercises. It is usually safe to do them often throughout the day. Warming up your hands before exercise may help reduce stiffness. You can do this with gentle massage or by placing your hands in warm water for 10-15 minutes. It is normal to feel some stretching, pulling, tightness, or mild discomfort when you begin new exercises. In time, this will improve. Remember to always be careful and stop right away if you feel sudden, very bad pain or your pain gets worse. You want to get better and be safe. Ask your health care provider which exercises are safe for you. Do exercises exactly as told by your provider and adjust them as told. Do not begin these exercises until told by your provider. Knuckle bend or "claw" fist  Stand or sit with your arm, hand, and all five fingers pointed straight up. Make sure to keep your wrist straight. Gently bend your fingers down toward your palm until the tips of your  fingers are touching your palm. Keep your big knuckle straight and only bend the small knuckles in your fingers. Hold this position for 10 seconds. Straighten your fingers back to your starting position. Repeat this exercise 5-10 times with each hand. Full finger fist  Stand or sit with your arm, hand, and all five fingers pointed straight up. Make sure to keep your wrist straight. Gently bend your fingers into your palm until the tips of your fingers are touching the middle of your palm. Hold this position for 10 seconds. Extend your fingers back to your starting position, stretching every joint fully. Repeat this exercise 5-10 times with each hand. Straight fist  Stand or sit with your arm, hand, and all five fingers pointed straight up. Make sure to keep your wrist straight. Gently bend your fingers at the big knuckle, where your fingers meet your hand, and at the middle knuckle. Keep the knuckle at  the tips of your fingers straight and try to touch the bottom of your palm. Hold this position for 10 seconds. Extend your fingers back to your starting position, stretching every joint fully. Repeat this exercise 5-10 times with each hand. Tabletop  Stand or sit with your arm, hand, and all five fingers pointed straight up. Make sure to keep your wrist straight. Gently bend your fingers at the big knuckle, where your fingers meet your hand, as far down as you can. Keep the small knuckles in your fingers straight. Think of forming a tabletop with your fingers. Hold this position for 10 seconds. Extend your fingers back to your starting position, stretching every joint fully. Repeat this exercise 5-10 times with each hand. Finger spread  Place your hand flat on a table with your palm facing down. Make sure your wrist stays straight. Spread your fingers and thumb apart from each other as far as you can until you feel a gentle stretch. Hold this position for 10 seconds. Bring your fingers and thumb tight together again. Hold this position for 10 seconds. Repeat this exercise 5-10 times with each hand. Making circles  Stand or sit with your arm, hand, and all five fingers pointed straight up. Make sure to keep your wrist straight. Make a circle by touching the tip of your thumb to the tip of your index finger. Hold for 10 seconds. Then open your hand wide. Repeat this motion with your thumb and each of your fingers. Repeat this exercise 5-10 times with each hand. Thumb motion  Sit with your forearm resting on a table and your wrist straight. Your thumb should be facing up toward the ceiling. Keep your fingers relaxed as you move your thumb. Lift your thumb up as high as you can toward the ceiling. Hold for 10 seconds. Bend your thumb across your palm as far as you can, reaching the tip of your thumb for the small finger (pinkie) side of your palm. Hold for 10 seconds. Repeat this exercise  5-10 times with each hand. Grip strengthening  Hold a stress ball or other soft ball in the middle of your hand. Slowly increase the pressure, squeezing the ball as much as you can without causing pain. Think of bringing the tips of your fingers into the middle of your palm. All of your finger joints should bend when doing this exercise. Hold your squeeze for 10 seconds, then relax. Repeat this exercise 5-10 times with each hand. Contact a health care provider if: Your hand pain or discomfort gets much worse when  you do an exercise. Your hand pain or discomfort does not improve within 2 hours after you exercise. If you have either of these problems, stop doing these exercises right away. Do not do them again unless your provider says that you can. Get help right away if: You develop sudden, severe hand pain or swelling. If this happens, stop doing these exercises right away. Do not do them again unless your provider says that you can. This information is not intended to replace advice given to you by your health care provider. Make sure you discuss any questions you have with your health care provider. Document Revised: 12/01/2022 Document Reviewed: 12/01/2022 [employer] Patient Education  2024 [employer]  Low Back Sprain or Strain Rehab Ask your health care provider which exercises are safe for you. Do exercises exactly as told by your health care provider and adjust them as directed. It is normal to feel mild stretching, pulling, tightness, or discomfort as you do these exercises. Stop right away if you feel sudden pain or your pain gets worse. Do not begin these exercises until told by your health care provider. Stretching and range-of-motion exercises These exercises warm up your muscles and joints and improve the movement and flexibility of your back. These exercises also help to relieve pain, numbness, and tingling. Lumbar rotation  Lie on your back on a firm bed or the floor with your  knees bent. Straighten your arms out to your sides so each arm forms a 90-degree angle (right angle) with a side of your body. Slowly move (rotate) both of your knees to one side of your body until you feel a stretch in your lower back (lumbar). Try not to let your shoulders lift off the floor. Hold this position for __________ seconds. Tense your abdominal muscles and slowly move your knees back to the starting position. Repeat this exercise on the other side of your body. Repeat __________ times. Complete this exercise __________ times a day. Single knee to chest  Lie on your back on a firm bed or the floor with both legs straight. Bend one of your knees. Use your hands to move your knee up toward your chest until you feel a gentle stretch in your lower back and buttock. Hold your leg in this position by holding on to the front of your knee. Keep your other leg as straight as possible. Hold this position for __________ seconds. Slowly return to the starting position. Repeat with your other leg. Repeat __________ times. Complete this exercise __________ times a day. Prone extension on elbows  Lie on your abdomen on a firm bed or the floor (prone position). Prop yourself up on your elbows. Use your arms to help lift your chest up until you feel a gentle stretch in your abdomen and your lower back. This will place some of your body weight on your elbows. If this is uncomfortable, try stacking pillows under your chest. Your hips should stay down, against the surface that you are lying on. Keep your hip and back muscles relaxed. Hold this position for __________ seconds. Slowly relax your upper body and return to the starting position. Repeat __________ times. Complete this exercise __________ times a day. Strengthening exercises These exercises build strength and endurance in your back. Endurance is the ability to use your muscles for a long time, even after they get tired. Pelvic  tilt This exercise strengthens the muscles that lie deep in the abdomen. Lie on your back on a firm bed  or the floor with your legs extended. Bend your knees so they are pointing toward the ceiling and your feet are flat on the floor. Tighten your lower abdominal muscles to press your lower back against the floor. This motion will tilt your pelvis so your tailbone points up toward the ceiling instead of pointing to your feet or the floor. To help with this exercise, you may place a small towel under your lower back and try to push your back into the towel. Hold this position for __________ seconds. Let your muscles relax completely before you repeat this exercise. Repeat __________ times. Complete this exercise __________ times a day. Alternating arm and leg raises  Get on your hands and knees on a firm surface. If you are on a hard floor, you may want to use padding, such as an exercise mat, to cushion your knees. Line up your arms and legs. Your hands should be directly below your shoulders, and your knees should be directly below your hips. Lift your left leg behind you. At the same time, raise your right arm and straighten it in front of you. Do not lift your leg higher than your hip. Do not lift your arm higher than your shoulder. Keep your abdominal and back muscles tight. Keep your hips facing the ground. Do not arch your back. Keep your balance carefully, and do not hold your breath. Hold this position for __________ seconds. Slowly return to the starting position. Repeat with your right leg and your left arm. Repeat __________ times. Complete this exercise __________ times a day. Abdominal set with straight leg raise  Lie on your back on a firm bed or the floor. Bend one of your knees and keep your other leg straight. Tense your abdominal muscles and lift your straight leg up, 4-6 inches (10-15 cm) off the ground. Keep your abdominal muscles tight and hold this position for  __________ seconds. Do not hold your breath. Do not arch your back. Keep it flat against the ground. Keep your abdominal muscles tense as you slowly lower your leg back to the starting position. Repeat with your other leg. Repeat __________ times. Complete this exercise __________ times a day. Single leg lower with bent knees Lie on your back on a firm bed or the floor. Tense your abdominal muscles and lift your feet off the floor, one foot at a time, so your knees and hips are bent in 90-degree angles (right angles). Your knees should be over your hips and your lower legs should be parallel to the floor. Keeping your abdominal muscles tense and your knee bent, slowly lower one of your legs so your toe touches the ground. Lift your leg back up to return to the starting position. Do not hold your breath. Do not let your back arch. Keep your back flat against the ground. Repeat with your other leg. Repeat __________ times. Complete this exercise __________ times a day. Posture and body mechanics Good posture and healthy body mechanics can help to relieve stress in your body's tissues and joints. Body mechanics refers to the movements and positions of your body while you do your daily activities. Posture is part of body mechanics. Good posture means: Your spine is in its natural S-curve position (neutral). Your shoulders are pulled back slightly. Your head is not tipped forward (neutral). Follow these guidelines to improve your posture and body mechanics in your everyday activities. Standing  When standing, keep your spine neutral and your feet about hip-width apart.  Keep a slight bend in your knees. Your ears, shoulders, and hips should line up. When you do a task in which you stand in one place for a long time, place one foot up on a stable object that is 2-4 inches (5-10 cm) high, such as a footstool. This helps keep your spine neutral. Sitting  When sitting, keep your spine neutral and  keep your feet flat on the floor. Use a footrest, if necessary, and keep your thighs parallel to the floor. Avoid rounding your shoulders, and avoid tilting your head forward. When working at a desk or a computer, keep your desk at a height where your hands are slightly lower than your elbows. Slide your chair under your desk so you are close enough to maintain good posture. When working at a computer, place your monitor at a height where you are looking straight ahead and you do not have to tilt your head forward or downward to look at the screen. Resting When lying down and resting, avoid positions that are most painful for you. If you have pain with activities such as sitting, bending, stooping, or squatting, lie in a position in which your body does not bend very much. For example, avoid curling up on your side with your arms and knees near your chest (fetal position). If you have pain with activities such as standing for a long time or reaching with your arms, lie with your spine in a neutral position and bend your knees slightly. Try the following positions: Lying on your side with a pillow between your knees. Lying on your back with a pillow under your knees. Lifting  When lifting objects, keep your feet at least shoulder-width apart and tighten your abdominal muscles. Bend your knees and hips and keep your spine neutral. It is important to lift using the strength of your legs, not your back. Do not lock your knees straight out. Always ask for help to lift heavy or awkward objects. This information is not intended to replace advice given to you by your health care provider. Make sure you discuss any questions you have with your health care provider. Document Revised: 03/22/2023 Document Reviewed: 02/03/2021 [employer] Patient Education  2024 [employer]  Osteoarthritis  Osteoarthritis is a type of arthritis. It refers to joint pain or joint disease. Osteoarthritis affects tissue that  covers the ends of bones in joints (cartilage). Cartilage acts as a cushion between the bones and helps them move smoothly. Osteoarthritis occurs when cartilage in the joints gets worn down. Osteoarthritis is sometimes called "wear and tear" arthritis. Osteoarthritis is the most common form of arthritis. It often occurs in older people. It is a condition that gets worse over time. The joints most often affected by this condition are in the fingers, toes, hips, knees, and spine, including the neck and lower back. What are the causes? This condition is caused by the wearing down of cartilage that covers the ends of bones. What increases the risk? The following factors may make you more likely to develop this condition: Being age 70 or older. Obesity. Overuse of joints. Past injury of a joint. Past surgery on a joint. Family history of osteoarthritis. What are the signs or symptoms? The main symptoms of this condition are pain, swelling, and stiffness in the joint. Other symptoms may include: An enlarged joint. More pain and further damage caused by small pieces of bone or cartilage that break off and float inside of the joint. Small deposits of bone (  osteophytes) that grow on the edges of the joint. A grating or scraping feeling inside the joint when you move it. Popping or creaking sounds when you move. Difficulty walking or exercising. An inability to grip items, twist your hand, or control the movements of your hands and fingers. How is this diagnosed? This condition may be diagnosed based on: Your medical history. A physical exam. Your symptoms. X-rays of the affected joints. Blood tests to rule out other types of arthritis. How is this treated? There is no cure for this condition, but treatment can help control pain and improve joint function. Treatment may include a combination of therapies, such as: Pain relief techniques, such as: Applying heat and cold to the joint. Massage. A  form of talk therapy called cognitive behavioral therapy (CBT). This therapy helps you set goals and follow up on the changes that you make. Medicines for pain and inflammation. The medicines can be taken by mouth or applied to the skin. They include: NSAIDs, such as ibuprofen. Prescription medicines. Strong anti-inflammatory medicines (corticosteroids). Certain nutritional supplements. A prescribed exercise program. You may work with a physical therapist. Assistive devices, such as a brace, wrap, splint, specialized glove, or cane. A weight control plan. Surgery, such as: An osteotomy. This is done to reposition the bones and relieve pain or to remove loose pieces of bone and cartilage. Joint replacement surgery. You may need this surgery if you have advanced osteoarthritis. Follow these instructions at home: Activity Rest your affected joints as told by your health care provider. Exercise as told by your provider. The provider may recommend specific types of exercise, such as: Strengthening exercises. These are done to strengthen the muscles that support joints affected by arthritis. Aerobic activities. These are exercises, such as brisk walking or water aerobics, that increase your heart rate. Range-of-motion activities. These help your joints move more easily. Balance and agility exercises. Managing pain, stiffness, and swelling     If told, apply heat to the affected area as often as told by your provider. Use the heat source that your provider recommends, such as a moist heat pack or a heating pad. If you have a removable assistive device, remove it as told by your provider. Place a towel between your skin and the heat source. If your provider tells you to keep the assistive device on while you apply heat, place a towel between the assistive device and the heat source. Leave the heat on for 20-30 minutes. If told, put ice on the affected area. If you have a removable assistive  device, remove it as told by your provider. Put ice in a plastic bag. Place a towel between your skin and the bag. If your provider tells you to keep the assistive device on during icing, place a towel between the assistive device and the bag. Leave the ice on for 20 minutes, 2-3 times a day. If your skin turns bright red, remove the ice or heat right away to prevent skin damage. The risk of damage is higher if you cannot feel pain, heat, or cold. Move your fingers or toes often to reduce stiffness and swelling. Raise (elevate) the affected area above the level of your heart while you are sitting or lying down. General instructions Take over-the-counter and prescription medicines only as told by your provider. Maintain a healthy weight. Follow instructions from your provider for weight control. Do not use any products that contain nicotine or tobacco. These products include cigarettes, chewing tobacco, and vaping devices,  such as e-cigarettes. If you need help quitting, ask your provider. Use assistive devices as told by your provider. Where to find more information [employer] of Arthritis and Musculoskeletal and Skin Diseases: niams.http://www.myers.net/ [employer] on Aging: BaseRingTones.pl American College of Rheumatology: rheumatology.org Contact a health care provider if: You have redness, swelling, or a feeling of warmth in a joint that gets worse. You have a fever along with joint or muscle aches. You develop a rash. You have trouble doing your normal activities. You have pain that gets worse and is not relieved by pain medicine. This information is not intended to replace advice given to you by your health care provider. Make sure you discuss any questions you have with your health care provider. Document Revised: 07/16/2022 Document Reviewed: 07/16/2022 Elsevier Patient Education  2024 [employer].

## 2024-03-01 ENCOUNTER — Ambulatory Visit: Payer: Managed Care, Other (non HMO) | Admitting: Rheumatology

## 2024-09-05 NOTE — Progress Notes (Signed)
 Office Visit Note  Patient: Annette Beard             Date of Birth: 1971-08-03           MRN: [id number]             PCP: Fernand Tracey LABOR, MD Referring: Fernand Tracey LABOR, MD Visit Date: 09/19/2024 Occupation: Data Unavailable  Subjective:  Pain in joints  History of Present Illness: Annette Beard is a 53 y.o. female with positive ANA, positive RNP, osteoarthritis and degenerative disc disease.  She returns today after her last visit in April 2025.  She states she continues to have generalized pain and discomfort in multiple joints.  She reports discomfort in her hands, knee joints, lower back.  She has not had any recent flares of plantar fasciitis.  She has had cortisone injections in her knee joints by her PCP several times in the past.  The last injections were in March.  She states these injections are not effective.  She is interested in getting viscosupplement injections.  She is not planning to have total knee replacement.  She continues to have mild sicca symptoms.  She denies any shortness of breath or Raynaud's phenomenon.  She continues to have fatigue.  She states psoriasis has improved since she has been using acitretin prescribed by her dermatologist.    Activities of Daily Living:  Patient reports morning stiffness for 10-15 minutes.   Patient Reports nocturnal pain.  Difficulty dressing/grooming: Denies Difficulty climbing stairs: Reports Difficulty getting out of chair: Reports Difficulty using hands for taps, buttons, cutlery, and/or writing: Denies  Review of Systems  Constitutional:  Positive for fatigue.  HENT:  Negative for mouth sores and mouth dryness.   Eyes:  Negative for dryness.  Respiratory:  Negative for shortness of breath.   Cardiovascular:  Negative for chest pain and palpitations.  Gastrointestinal:  Negative for blood in stool, constipation and diarrhea.  Endocrine: Negative for increased urination.  Genitourinary:  Negative for involuntary urination.   Musculoskeletal:  Positive for joint pain, gait problem, joint pain, joint swelling, myalgias, muscle weakness, morning stiffness, muscle tenderness and myalgias.  Skin:  Positive for hair loss. Negative for color change, rash and sensitivity to sunlight.  Allergic/Immunologic: Negative for susceptible to infections.  Neurological:  Positive for headaches. Negative for dizziness.  Hematological:  Negative for swollen glands.  Psychiatric/Behavioral:  Negative for depressed mood and sleep disturbance. The patient is not nervous/anxious.     PMFS History:  Patient Active Problem List   Diagnosis Date Noted   Class 3 severe obesity without serious comorbidity with body mass index (BMI) of 40.0 to 44.9 in adult [facility]) 11/12/2020   Essential hypertension 11/12/2020   Cyst of right ovary 06/28/2019    Past Medical History:  Diagnosis Date   Anemia    Arthritis    GERD (gastroesophageal reflux disease)    Hypertension    Prediabetes    PRP (pityriasis rubra pilaris)     Family History  Problem Relation Age of Onset   Hypertension Mother    Diabetes Sister    Hypertension Sister    Hypertension Sister    Diabetes Brother    Hypertension Brother    Hypertension Brother    Colon cancer Neg Hx    Colon polyps Neg Hx    Rectal cancer Neg Hx    Stomach cancer Neg Hx    Esophageal cancer Neg Hx    Past Surgical History:  Procedure Laterality Date  ABDOMINAL HERNIA REPAIR  2016   CESAREAN SECTION     x3   PARTIAL HYSTERECTOMY  2014   Social History   Tobacco Use   Smoking status: Former    Types: Cigarettes    Passive exposure: Past   Smokeless tobacco: Never  Vaping Use   Vaping status: Never Used  Substance Use Topics   Alcohol use: Not Currently   Drug use: Never   Social History   Social History Narrative   Not on file      There is no immunization history on file for this patient.   Objective: Vital Signs: BP 121/82   Pulse 74   Temp 97.7 F (36.5 C)    Resp 15   Ht 5' 6 (1.676 m)   Wt 220 lb 6.4 oz (100 kg)   BMI 35.57 kg/m    Physical Exam Vitals and nursing note reviewed.  Constitutional:      Appearance: She is well-developed.  HENT:     Head: Normocephalic and atraumatic.  Eyes:     Conjunctiva/sclera: Conjunctivae normal.  Cardiovascular:     Rate and Rhythm: Normal rate and regular rhythm.     Heart sounds: Normal heart sounds.  Pulmonary:     Effort: Pulmonary effort is normal.     Breath sounds: Normal breath sounds.  Abdominal:     General: Bowel sounds are normal.     Palpations: Abdomen is soft.  Musculoskeletal:     Cervical back: Normal range of motion.  Lymphadenopathy:     Cervical: No cervical adenopathy.  Skin:    General: Skin is warm and dry.     Capillary Refill: Capillary refill takes less than 2 seconds.  Neurological:     Mental Status: She is alert and oriented to person, place, and time.  Psychiatric:        Behavior: Behavior normal.      Musculoskeletal Exam: Patient has good range of motion of the cervical spine.  There was no tenderness over thoracic or lumbar spine.  Shoulders, elbows, wrist joints, MCPs PIPs and DIPs were in good range of motion.  Bilateral PIP and DIP thickening was noted.  Hip joints and knee joints were in good range of motion.  She had discomfort with range of motion of the bilateral knee joints and crepitus.  She had tenderness over left first MTP joint.  Hallux rigidus and valgus deformity was noted.  No synovitis was noted.  CDAI Exam: CDAI Score: -- Patient Global: --; Provider Global: -- Swollen: --; Tender: -- Joint Exam 09/19/2024   No joint exam has been documented for this visit   There is currently no information documented on the homunculus. Go to the Rheumatology activity and complete the homunculus joint exam.  Investigation: No additional findings.  Imaging: No results found.  Recent Labs: No results found for: WBC, HGB, PLT, NA,  K, CL, CO2, GLUCOSE, BUN, CREATININE, BILITOT, ALKPHOS, AST, ALT, PROT, ALBUMIN, CALCIUM, GFRAA, QFTBGOLD, QFTBGOLDPLUS  Speciality Comments: No specialty comments available.  Procedures:  No procedures performed Allergies: Levofloxacin, Moxifloxacin, Ciprofloxacin, and Tramadol   Assessment / Plan:     Visit Diagnoses: Positive ANA (antinuclear antibody) - Positive ANA positive RNP, fatigue, dry mouth, dry eyes, arthralgias, rash.  She states dry mouth and dry eyes eye symptoms have improved.  She has not had any recent rash.  She continues to have fatigue and joint pain.  No synovitis was noted on the examination.  She denies shortness  of breath or Raynaud's phenomenon.  I will recheck labs today.  Will contact her once the lab results are available.  She was advised to contact us  if she develops any new symptoms.  Primary osteoarthritis of both hands -she has bilateral PIP and DIP thickening.  She complains of discomfort in her hands.  Joint protection was discussed.  Radiographic findings suggestive of osteoarthritis.  Primary osteoarthritis of both knees -she has had cortisone injections in the past and also has tried NSAIDs without much relief.  She had crepitus in her bilateral knee joints.  She has ongoing pain in her knee joints.  She has had cortisone injections several times in the past and the last injections were in March.  Patient does not get much relief from cortisone injection.  She requested viscosupplement injections.  Will apply for viscosupplement injections for bilateral knee joints.  She is not interested in getting total knee replacement.  X-rays showed left knee severe osteoarthritis and severe chondromalacia patella, right knee moderate osteoarthritis and moderate chondromalacia patella. This patient is diagnosed with osteoarthritis of the knee(s).    Radiographs show evidence of joint space narrowing, osteophytes, subchondral sclerosis and/or  subchondral cysts.  This patient has knee pain which interferes with functional and activities of daily living.    This patient has experienced inadequate response, adverse effects and/or intolerance with conservative treatments such as acetaminophen , NSAIDS, topical creams, physical therapy or regular exercise, knee bracing and/or weight loss.   This patient has experienced inadequate response or has a contraindication to intra articular steroid injections for at least 3 months.   This patient is not scheduled to have a total knee replacement within 6 months of starting treatment with viscosupplementation.   Pain in both feet -she continues to have discomfort in feet.  She has been having pain and discomfort in her left first MTP joint.  Shoes with right toebox was discussed.  She has hallux rigidus and valgus deformity.  X-rays were unremarkable.  Inferior calcaneal spur was noted in the right foot.  Pes planus of both feet-use of arch support was advised.  Plantar fasciitis - History of recurrent plantar fasciitis for the last 20 years.  No recent episodes reported per patient.  Lumbar spondylosis -she continues to have some lower back pain.  X-rays showed multilevel spondylosis and facet joint arthropathy.  L5-S1 spondylolisthesis and dextroscoliosis were noted.  Wedging of T11 vertebra.  Strength exercise were discussed.  Psoriasis-her symptoms have improved on acitretin prescribed by her dermatologist.  PRP (pityriasis rubra pilaris) - Erythematous macular rash on the right thigh.  Patient follows by Dr. Merrell.  She is on acitretin.  Myalgia - Generalized pain, hyperalgesia for last 25 years.  Other medical problems are listed as follows:  Brain fog  Other fatigue  Gastroesophageal reflux disease without esophagitis  Primary hypertension  Controlled type 2 diabetes mellitus without complication, without long-term current use of insulin (HCC)  Family history of ulcerative  colitis-maternal aunt  Orders: Orders Placed This Encounter  Procedures   CBC with Differential/Platelet   Comprehensive metabolic panel with GFR   Anti-DNA antibody, double-stranded   C3 and C4   Sedimentation rate   Protein / creatinine ratio, urine   RNP Antibody   No orders of the defined types were placed in this encounter.    Follow-Up Instructions: Return in about 6 months (around 03/20/2025) for Positive ANA, osteoarthritis.   Annette Nash, MD  Note - This record has been created using Animal nutritionist.  Chart creation errors have been sought, but may not always  have been located. Such creation errors do not reflect on  the standard of medical care.

## 2024-09-19 ENCOUNTER — Encounter: Payer: Self-pay | Admitting: *Deleted

## 2024-09-19 ENCOUNTER — Ambulatory Visit: Attending: Rheumatology | Admitting: Rheumatology

## 2024-09-19 ENCOUNTER — Other Ambulatory Visit: Payer: Self-pay | Admitting: Medical Genetics

## 2024-09-19 ENCOUNTER — Encounter: Payer: Self-pay | Admitting: Rheumatology

## 2024-09-19 ENCOUNTER — Telehealth: Payer: Self-pay | Admitting: Rheumatology

## 2024-09-19 VITALS — BP 121/82 | HR 74 | Temp 97.7°F | Resp 15 | Ht 66.0 in | Wt 220.4 lb

## 2024-09-19 DIAGNOSIS — L440 Pityriasis rubra pilaris: Secondary | ICD-10-CM

## 2024-09-19 DIAGNOSIS — K219 Gastro-esophageal reflux disease without esophagitis: Secondary | ICD-10-CM

## 2024-09-19 DIAGNOSIS — M19041 Primary osteoarthritis, right hand: Secondary | ICD-10-CM | POA: Diagnosis not present

## 2024-09-19 DIAGNOSIS — R7689 Other specified abnormal immunological findings in serum: Secondary | ICD-10-CM | POA: Diagnosis not present

## 2024-09-19 DIAGNOSIS — M47816 Spondylosis without myelopathy or radiculopathy, lumbar region: Secondary | ICD-10-CM

## 2024-09-19 DIAGNOSIS — E119 Type 2 diabetes mellitus without complications: Secondary | ICD-10-CM

## 2024-09-19 DIAGNOSIS — M79672 Pain in left foot: Secondary | ICD-10-CM

## 2024-09-19 DIAGNOSIS — Z8379 Family history of other diseases of the digestive system: Secondary | ICD-10-CM

## 2024-09-19 DIAGNOSIS — M79671 Pain in right foot: Secondary | ICD-10-CM

## 2024-09-19 DIAGNOSIS — M170 Bilateral primary osteoarthritis of knee: Secondary | ICD-10-CM | POA: Diagnosis not present

## 2024-09-19 DIAGNOSIS — L409 Psoriasis, unspecified: Secondary | ICD-10-CM

## 2024-09-19 DIAGNOSIS — R4189 Other symptoms and signs involving cognitive functions and awareness: Secondary | ICD-10-CM

## 2024-09-19 DIAGNOSIS — I10 Essential (primary) hypertension: Secondary | ICD-10-CM

## 2024-09-19 DIAGNOSIS — M2142 Flat foot [pes planus] (acquired), left foot: Secondary | ICD-10-CM

## 2024-09-19 DIAGNOSIS — M722 Plantar fascial fibromatosis: Secondary | ICD-10-CM

## 2024-09-19 DIAGNOSIS — M791 Myalgia, unspecified site: Secondary | ICD-10-CM

## 2024-09-19 DIAGNOSIS — M19042 Primary osteoarthritis, left hand: Secondary | ICD-10-CM

## 2024-09-19 DIAGNOSIS — R5383 Other fatigue: Secondary | ICD-10-CM

## 2024-09-19 DIAGNOSIS — M2141 Flat foot [pes planus] (acquired), right foot: Secondary | ICD-10-CM

## 2024-09-19 NOTE — Telephone Encounter (Signed)
 Patient did not have her insurance card.  Unable to verify with information (ID number) patient provided.  Once patient uploads a copy of her insurance card to her chart we will be able to apply for injections.

## 2024-09-19 NOTE — Telephone Encounter (Signed)
-----   Message from Reena Stark sent at 09/19/2024  8:28 AM EDT ----- Regarding: VISCO BIL KNEES Please apply for visco, bil knees. Patient never used visco before. Thank you.

## 2024-09-19 NOTE — Patient Instructions (Signed)
 Exercises for Chronic Knee Pain  Chronic knee pain is pain that lasts longer than 3 months. For most people with chronic knee pain, exercise and weight loss is an important part of treatment. Your health care provider may want you to focus on:  Making the muscles that support your knee stronger. This can take pressure off your knee and reduce pain.  Preventing knee stiffness.  How far you can move your knee, keeping it there or making it farther.  Losing weight (if this applies) to take pressure off your knee, lower your risk for injury, and make it easier for you to exercise.  Your provider will help you make an exercise program that fits your needs and physical abilities. Below are simple, low-impact exercises you can do at home. Ask your provider or physical therapist how often you should do your exercise program and how many times to repeat each exercise.  General safety tips    Get your provider's approval before doing any exercises.  Start slowly and stop any time you feel pain.  Do not exercise if your knee pain is flaring up.  Warm up first. Stretching a cold muscle can cause an injury. Do 5-10 minutes of easy movement or light stretching before beginning your exercises.  Do 5-10 minutes of low-impact activity (like walking or cycling) before starting strengthening exercises.  Contact your provider any time you have pain during or after exercising. Exercise can cause discomfort but should not be painful. It is normal to be a little stiff or sore after exercising.  Stretching and range-of-motion exercises  Front thigh stretch    Stand up straight and support your body by holding on to a chair or resting one hand on a Mastandrea.  With your legs straight and close together, bend one knee to lift your heel up toward your butt.  Using one hand for support, grab your ankle with your free hand.  Pull your foot up closer toward your butt to feel the stretch in front of your thigh.  Hold the stretch for 30  seconds.  Repeat __________ times. Complete this exercise __________ times a day.  Back thigh stretch    Sit on the floor with your back straight and your legs out straight in front of you.  Place the palms of your hands on the floor and slide them toward your feet as you bend at the hip.  Try to touch your nose to your knees and feel the stretch in the back of your thighs.  Hold for 30 seconds.  Repeat __________ times. Complete this exercise __________ times a day.  Calf stretch    Stand facing a Sheehy.  Place the palms of your hands flat against the Ghazi, arms extended, and lean slightly against the Muha.  Get into a lunge position with one leg bent at the knee and the other leg stretched out straight behind you.  Keep both feet facing the Ziesmer and increase the bend in your knee while keeping the heel of the other leg flat on the ground.  You should feel the stretch in your calf. Hold for 30 seconds.  Repeat __________ times. Complete this exercise __________ times a day.  Strengthening exercises  Straight leg lift    Lie on your back with one knee bent and the other leg out straight.  Slowly lift the straight leg without bending the knee.  Lift until your foot is about 12 inches (30 cm) off the floor.  Hold for  3-5 seconds and slowly lower your leg.  Repeat __________ times. Complete this exercise __________ times a day.  Single leg dip    Stand between two chairs and put both hands on the backs of the chairs for support.  Extend one leg out straight with your body weight resting on the heel of the standing leg.  Slowly bend your standing knee to dip your body to the level that is comfortable for you.  Hold for 3-5 seconds.  Repeat __________ times. Complete this exercise __________ times a day.  Hamstring curls    Stand straight, knees close together, facing the back of a chair.  Hold on to the back of a chair with both hands.  Keep one leg straight. Bend the other knee while bringing the heel up toward the butt  until the knee is bent at a 90-degree angle (right angle).  Hold for 3-5 seconds.  Repeat __________ times. Complete this exercise __________ times a day.  Neece squat    Stand straight with your back, hips, and head against a Rushing.  Step forward one foot at a time with your back still against the Spargur.  Your feet should be 2 feet (61 cm) from the Jeanpaul at shoulder width. Keeping your back, hips, and head against the Colligan, slide down the Brenner to as close to a sitting position as you can get.  Hold for 5-10 seconds, then slowly slide back up.  Repeat __________ times. Complete this exercise __________ times a day.  Step-ups    Stand in front of a sturdy platform or stool that is about 6 inches (15 cm) high.  Slowly step up with your left / right foot, keeping your knee in line with your hip and foot. Do not let your knee bend so far that you cannot see your toes. Hold on to a chair for balance, but do not use it for support.  Slowly unlock your knee and lower yourself to the starting position.  Repeat __________ times. Complete this exercise __________ times a day.  Contact a health care provider if:  Your exercises cause pain.  Your pain is worse after you exercise.  Your pain prevents you from doing your exercises.  This information is not intended to replace advice given to you by your health care provider. Make sure you discuss any questions you have with your health care provider.  Document Revised: 12/01/2022 Document Reviewed: 12/01/2022  Elsevier Patient Education  2024 [employer].

## 2024-09-20 LAB — COMPREHENSIVE METABOLIC PANEL WITH GFR
AG Ratio: 1.8 (calc) (ref 1.0–2.5)
ALT: 19 U/L (ref 6–29)
AST: 20 U/L (ref 10–35)
Albumin: 4.7 g/dL (ref 3.6–5.1)
Alkaline phosphatase (APISO): 74 U/L (ref 37–153)
BUN: 18 mg/dL (ref 7–25)
CO2: 27 mmol/L (ref 20–32)
Calcium: 9.5 mg/dL (ref 8.6–10.4)
Chloride: 103 mmol/L (ref 98–110)
Creat: 0.74 mg/dL (ref 0.50–1.03)
Globulin: 2.6 g/dL (ref 1.9–3.7)
Glucose, Bld: 94 mg/dL (ref 65–99)
Potassium: 4.7 mmol/L (ref 3.5–5.3)
Sodium: 139 mmol/L (ref 135–146)
Total Bilirubin: 0.2 mg/dL (ref 0.2–1.2)
Total Protein: 7.3 g/dL (ref 6.1–8.1)
eGFR: 96 mL/min/1.73m2 (ref >=60)

## 2024-09-20 LAB — CBC WITH DIFFERENTIAL/PLATELET
Absolute Lymphocytes: 1416 {cells}/uL (ref 850–3900)
Absolute Monocytes: 354 {cells}/uL (ref 200–950)
Basophils Absolute: 41 {cells}/uL (ref 0–200)
Basophils Relative: 0.7 %
Eosinophils Absolute: 53 {cells}/uL (ref 15–500)
Eosinophils Relative: 0.9 %
HCT: 35.1 % (ref 35.0–45.0)
Hemoglobin: 11.1 g/dL — ABNORMAL LOW (ref 11.7–15.5)
MCH: 27 pg (ref 27.0–33.0)
MCHC: 31.6 g/dL — ABNORMAL LOW (ref 32.0–36.0)
MCV: 85.4 fL (ref 80.0–100.0)
MPV: 10.1 fL (ref 7.5–12.5)
Monocytes Relative: 6 %
Neutro Abs: 4036 {cells}/uL (ref 1500–7800)
Neutrophils Relative %: 68.4 %
Platelets: 300 Thousand/uL (ref 140–400)
RBC: 4.11 Million/uL (ref 3.80–5.10)
RDW: 14 % (ref 11.0–15.0)
Total Lymphocyte: 24 %
WBC: 5.9 Thousand/uL (ref 3.8–10.8)

## 2024-09-20 LAB — PROTEIN / CREATININE RATIO, URINE
Creatinine, Urine: 26 mg/dL (ref 20–275)
Total Protein, Urine: <4 mg/dL — ABNORMAL LOW (ref 5–24)

## 2024-09-20 LAB — C3 AND C4
C3 Complement: 168 mg/dL (ref 83–193)
C4 Complement: 20 mg/dL (ref 15–57)

## 2024-09-20 LAB — SEDIMENTATION RATE: Sed Rate: 19 mm/h (ref 0–30)

## 2024-09-20 LAB — ANTI-DNA ANTIBODY, DOUBLE-STRANDED: ds DNA Ab: 2 [IU]/mL

## 2024-09-20 LAB — RNP ANTIBODY: Ribonucleic Protein(ENA) Antibody, IgG: 1.1 AI — AB

## 2024-09-21 ENCOUNTER — Ambulatory Visit: Payer: Self-pay | Admitting: Rheumatology

## 2024-09-21 NOTE — Progress Notes (Signed)
 Sed rate normal, complements normal, RNP low titer positive, double-stranded DNA negative, CBC shows mild anemia.  CMP normal, urine protein creatinine ratio normal.  Labs do not indicate an autoimmune disease flare.  Patient may consider taking multivitamin with iron.  Please forward results to her PCP.

## 2024-10-03 NOTE — Telephone Encounter (Signed)
 Patient declined scheduling Visco injections due to her insurance's high deductible.

## 2024-10-31 ENCOUNTER — Other Ambulatory Visit: Payer: Self-pay | Admitting: Medical Genetics

## 2024-10-31 DIAGNOSIS — Z006 Encounter for examination for normal comparison and control in clinical research program: Secondary | ICD-10-CM

## 2024-11-08 LAB — GENECONNECT MOLECULAR SCREEN: Genetic Analysis Overall Interpretation: NEGATIVE

## 2024-12-12 ENCOUNTER — Other Ambulatory Visit (HOSPITAL_COMMUNITY)

## 2025-03-20 ENCOUNTER — Ambulatory Visit: Admitting: Physician Assistant
